# Patient Record
Sex: Male | Born: 1985 | Race: White | Hispanic: No | Smoking: Never smoker
Health system: Southern US, Community
[De-identification: ages and names within clinical notes are randomized; demographics above are authoritative.]

## PROBLEM LIST (undated history)

## (undated) DIAGNOSIS — G40909 Epilepsy, unspecified, not intractable, without status epilepticus: Secondary | ICD-10-CM

## (undated) DIAGNOSIS — N289 Disorder of kidney and ureter, unspecified: Secondary | ICD-10-CM

## (undated) DIAGNOSIS — R569 Unspecified convulsions: Secondary | ICD-10-CM

---
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
## 2006-10-15 ENCOUNTER — Emergency Department (HOSPITAL_COMMUNITY): Admission: EM | Admit: 2006-10-15 | Discharge: 2006-10-15 | Payer: Self-pay | Admitting: Emergency Medicine

## 2007-09-07 ENCOUNTER — Emergency Department (HOSPITAL_COMMUNITY): Admission: EM | Admit: 2007-09-07 | Discharge: 2007-09-07 | Payer: Self-pay | Admitting: Emergency Medicine

## 2007-12-24 ENCOUNTER — Ambulatory Visit (HOSPITAL_COMMUNITY): Admission: RE | Admit: 2007-12-24 | Discharge: 2007-12-24 | Payer: Self-pay | Admitting: Pulmonary Disease

## 2008-07-17 ENCOUNTER — Emergency Department (HOSPITAL_COMMUNITY): Admission: EM | Admit: 2008-07-17 | Discharge: 2008-07-17 | Payer: Self-pay | Admitting: Emergency Medicine

## 2009-10-03 ENCOUNTER — Emergency Department (HOSPITAL_COMMUNITY): Admission: EM | Admit: 2009-10-03 | Discharge: 2009-10-03 | Payer: Self-pay | Admitting: Emergency Medicine

## 2010-01-23 ENCOUNTER — Emergency Department (HOSPITAL_COMMUNITY): Admission: EM | Admit: 2010-01-23 | Discharge: 2010-01-23 | Payer: Self-pay | Admitting: Emergency Medicine

## 2010-05-09 ENCOUNTER — Emergency Department (HOSPITAL_COMMUNITY): Admission: EM | Admit: 2010-05-09 | Discharge: 2010-05-09 | Payer: Self-pay | Admitting: Emergency Medicine

## 2010-10-03 LAB — POCT I-STAT, CHEM 8
BUN: 10 mg/dL (ref 6–23)
Calcium, Ion: 1.16 mmol/L (ref 1.12–1.32)
Chloride: 107 mEq/L (ref 96–112)
Creatinine, Ser: 0.8 mg/dL (ref 0.4–1.5)
Glucose, Bld: 112 mg/dL — ABNORMAL HIGH (ref 70–99)
HCT: 45 % (ref 39.0–52.0)
Hemoglobin: 15.3 g/dL (ref 13.0–17.0)
Potassium: 3.9 mEq/L (ref 3.5–5.1)
Sodium: 141 mEq/L (ref 135–145)
TCO2: 25 mmol/L (ref 0–100)

## 2011-03-03 ENCOUNTER — Emergency Department (HOSPITAL_COMMUNITY): Payer: Medicaid Other

## 2011-03-03 ENCOUNTER — Encounter: Payer: Self-pay | Admitting: *Deleted

## 2011-03-03 ENCOUNTER — Emergency Department (HOSPITAL_COMMUNITY)
Admission: EM | Admit: 2011-03-03 | Discharge: 2011-03-03 | Disposition: A | Discharge status: Home / Self Care | Payer: Medicaid Other | Attending: Emergency Medicine | Admitting: Emergency Medicine

## 2011-03-03 DIAGNOSIS — S20219A Contusion of unspecified front wall of thorax, initial encounter: Secondary | ICD-10-CM | POA: Insufficient documentation

## 2011-03-03 DIAGNOSIS — S80211A Abrasion, right knee, initial encounter: Secondary | ICD-10-CM

## 2011-03-03 DIAGNOSIS — S80212A Abrasion, left knee, initial encounter: Secondary | ICD-10-CM

## 2011-03-03 DIAGNOSIS — IMO0002 Reserved for concepts with insufficient information to code with codable children: Secondary | ICD-10-CM | POA: Insufficient documentation

## 2011-03-03 HISTORY — DX: Unspecified convulsions: R56.9

## 2011-03-03 HISTORY — DX: Epilepsy, unspecified, not intractable, without status epilepticus: G40.909

## 2011-03-03 NOTE — ED Provider Notes (Signed)
Medical screening examination/treatment/procedure(s) were performed by non-physician practitioner and as supervising physician I was immediately available for consultation/collaboration.   Aryaan Persichetti R. Ginamarie Banfield, MD 03/03/11 2310 
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
## 2011-08-18 ENCOUNTER — Emergency Department (HOSPITAL_COMMUNITY)
Admission: EM | Admit: 2011-08-18 | Discharge: 2011-08-18 | Disposition: A | Discharge status: Home / Self Care | Payer: Medicaid Other | Attending: Emergency Medicine | Admitting: Emergency Medicine

## 2011-08-18 ENCOUNTER — Encounter (HOSPITAL_COMMUNITY): Payer: Self-pay | Admitting: *Deleted

## 2011-08-18 DIAGNOSIS — R220 Localized swelling, mass and lump, head: Secondary | ICD-10-CM | POA: Insufficient documentation

## 2011-08-18 DIAGNOSIS — K0889 Other specified disorders of teeth and supporting structures: Secondary | ICD-10-CM

## 2011-08-18 DIAGNOSIS — K089 Disorder of teeth and supporting structures, unspecified: Secondary | ICD-10-CM | POA: Insufficient documentation

## 2011-08-18 MED ORDER — PENICILLIN V POTASSIUM 500 MG PO TABS: 500.0000 mg | ORAL_TABLET | Freq: Four times a day (QID) | ORAL | Status: AC

## 2011-08-18 MED ORDER — OXYCODONE-ACETAMINOPHEN 5-325 MG PO TABS
1.0000 | ORAL_TABLET | Freq: Once | ORAL | Status: AC
  Administered 2011-08-18: 1 via ORAL
  Filled 2011-08-18: qty 1

## 2011-08-18 MED ORDER — PENICILLIN V POTASSIUM 250 MG PO TABS
500.0000 mg | ORAL_TABLET | Freq: Once | ORAL | Status: AC
  Administered 2011-08-18: 500 mg via ORAL
  Filled 2011-08-18: qty 2

## 2011-08-18 MED ORDER — HYDROCODONE-ACETAMINOPHEN 5-325 MG PO TABS: 1.0000 | ORAL_TABLET | Freq: Four times a day (QID) | ORAL | Status: AC | PRN

## 2011-08-18 NOTE — ED Provider Notes (Addendum)
History     CSN: [id number]  Arrival date & time 08/18/11  4098   First MD Initiated Contact with Patient 08/18/11 0354      Chief Complaint  Patient presents with  . Dental Pain    (Consider location/radiation/quality/duration/timing/severity/associated sxs/prior treatment) Patient is a 26 y.o. male presenting with tooth pain. The history is provided by the patient (pt complains of a toothache.  right upper mouth). No language interpreter was used.  Dental PainThe primary symptoms include mouth pain. Primary symptoms do not include oral bleeding, headaches or cough. The symptoms began 2 days ago. The symptoms are worsening. The symptoms are new. The symptoms occur constantly.  Mouth pain began more than 1 week ago. Mouth pain occurs constantly. Affected locations include: cheek(s).  Additional symptoms include: gum swelling and facial swelling. Additional symptoms do not include: trouble swallowing, pain with swallowing and fatigue. Medical issues do not include: alcohol problem.    Past Medical History  Diagnosis Date  . Seizures   . Epilepsy     History reviewed. No pertinent past surgical history.  History reviewed. No pertinent family history.  History  Substance Use Topics  . Smoking status: Never Smoker   . Smokeless tobacco: Not on file  . Alcohol Use: No      Review of Systems  Constitutional: Negative for fatigue.  HENT: Positive for facial swelling. Negative for congestion, trouble swallowing, sinus pressure and ear discharge.        Toothache  Eyes: Negative for discharge.  Respiratory: Negative for cough.   Cardiovascular: Negative for chest pain.  Gastrointestinal: Negative for abdominal pain and diarrhea.  Genitourinary: Negative for frequency and hematuria.  Musculoskeletal: Negative for back pain.  Skin: Negative for rash.  Neurological: Negative for seizures and headaches.  Hematological: Negative.   Psychiatric/Behavioral: Negative for  hallucinations.    Allergies  Aspirin  Home Medications   Current Outpatient Rx  Name Route Sig Dispense Refill  . HYDROCODONE-ACETAMINOPHEN 5-325 MG PO TABS Oral Take 1 tablet by mouth every 6 (six) hours as needed for pain. 20 tablet 0  . PENICILLIN V POTASSIUM 500 MG PO TABS Oral Take 1 tablet (500 mg total) by mouth 4 (four) times daily. 40 tablet 0    BP 148/84  Pulse 100  Temp(Src) 98.2 F (36.8 C) (Oral)  Resp 20  Ht 5\' 6"  (1.676 m)  Wt 265 lb (120.203 kg)  BMI 42.77 kg/m2  SpO2 98%  Physical Exam  Constitutional: He is oriented to person, place, and time. He appears well-developed.  HENT:  Head: Normocephalic and atraumatic.       Tender tooth right upper gum,  With some swelling to cheek  Eyes: Conjunctivae and EOM are normal. No scleral icterus.  Neck: Neck supple. No thyromegaly present.  Cardiovascular: Normal rate and regular rhythm.  Exam reveals no gallop and no friction rub.   No murmur heard. Pulmonary/Chest: No stridor. He has no wheezes. He has no rales. He exhibits no tenderness.  Abdominal: He exhibits no distension. There is no tenderness. There is no rebound.  Musculoskeletal: Normal range of motion. He exhibits no edema.  Lymphadenopathy:    He has no cervical adenopathy.  Neurological: He is oriented to person, place, and time. Coordination normal.  Skin: No rash noted. No erythema.  Psychiatric: He has a normal mood and affect. His behavior is normal.    ED Course  Procedures (including critical care time)  Labs Reviewed - No data to display No  results found.   1. Toothache       MDM          Benny Lennert, MD 08/18/11 5284  Benny Lennert, MD 08/18/11 [phone]

## 2012-02-22 ENCOUNTER — Emergency Department (HOSPITAL_COMMUNITY)
Admission: EM | Admit: 2012-02-22 | Discharge: 2012-02-22 | Disposition: A | Discharge status: Home / Self Care | Payer: Medicaid Other | Attending: Emergency Medicine | Admitting: Emergency Medicine

## 2012-02-22 ENCOUNTER — Encounter (HOSPITAL_COMMUNITY): Payer: Self-pay

## 2012-02-22 DIAGNOSIS — Y9301 Activity, walking, marching and hiking: Secondary | ICD-10-CM | POA: Insufficient documentation

## 2012-02-22 DIAGNOSIS — S93409A Sprain of unspecified ligament of unspecified ankle, initial encounter: Secondary | ICD-10-CM

## 2012-02-22 DIAGNOSIS — Y998 Other external cause status: Secondary | ICD-10-CM | POA: Insufficient documentation

## 2012-02-22 DIAGNOSIS — Y92009 Unspecified place in unspecified non-institutional (private) residence as the place of occurrence of the external cause: Secondary | ICD-10-CM | POA: Insufficient documentation

## 2012-02-22 DIAGNOSIS — G40909 Epilepsy, unspecified, not intractable, without status epilepticus: Secondary | ICD-10-CM | POA: Insufficient documentation

## 2012-02-22 DIAGNOSIS — X500XXA Overexertion from strenuous movement or load, initial encounter: Secondary | ICD-10-CM | POA: Insufficient documentation

## 2012-02-22 MED ORDER — NAPROXEN 500 MG PO TABS: 500.0000 mg | ORAL_TABLET | Freq: Two times a day (BID) | ORAL | Status: AC

## 2012-02-22 MED ORDER — IBUPROFEN 800 MG PO TABS
ORAL_TABLET | ORAL | Status: AC
  Filled 2012-02-22: qty 1

## 2012-02-22 MED ORDER — IBUPROFEN 800 MG PO TABS
800.0000 mg | ORAL_TABLET | Freq: Once | ORAL | Status: AC
  Administered 2012-02-22: 800 mg via ORAL

## 2012-02-22 NOTE — ED Provider Notes (Signed)
History     CSN: [id number]  Arrival date & time 02/22/12  0110   None     Chief Complaint  Patient presents with  . Ankle Injury    (Consider location/radiation/quality/duration/timing/severity/associated sxs/prior treatment) HPI Comments: 26 year old male with a history of ankle sprain which occurred approximately 7 hours prior to arrival. He states that he was walking in his yard, stepped on an uneven piece of ground in his ankle inverted causing gradual onset of pain. He has been able to ambulate since that time though it does hurt his ankle. He admits to having a mild amount of associated swelling. The symptoms are persistent, mild, worse with ambulation.  Patient is a 26 y.o. male presenting with lower extremity injury. The history is provided by the patient and a parent.  Ankle Injury    Past Medical History  Diagnosis Date  . Seizures   . Epilepsy     History reviewed. No pertinent past surgical history.  No family history on file.  History  Substance Use Topics  . Smoking status: Never Smoker   . Smokeless tobacco: Not on file  . Alcohol Use: No      Review of Systems  HENT: Negative for neck pain.   Gastrointestinal: Negative for nausea and vomiting.  Musculoskeletal: Positive for joint swelling ( ankle). Negative for back pain.  Neurological: Negative for weakness and numbness.    Allergies  Aspirin  Home Medications   Current Outpatient Rx  Name Route Sig Dispense Refill  . NAPROXEN 500 MG PO TABS Oral Take 1 tablet (500 mg total) by mouth 2 (two) times daily with a meal. 30 tablet 0    BP 139/81  Pulse 94  Temp 98.3 F (36.8 C) (Oral)  Resp 20  Ht 5\' 7"  (1.702 m)  Wt 260 lb (117.935 kg)  BMI 40.72 kg/m2  SpO2 99%  Physical Exam  Nursing note and vitals reviewed. Constitutional: He appears well-developed and well-nourished. No distress.  HENT:  Head: Normocephalic and atraumatic.  Eyes: Conjunctivae are normal. No scleral icterus.    Cardiovascular: Normal rate, regular rhythm and intact distal pulses.   Pulmonary/Chest: Effort normal and breath sounds normal.  Musculoskeletal: He exhibits tenderness. He exhibits no edema.       Focal tenderness to palpation over the ligament inferior to the lateral malleolus of the right ankle. No other tenderness, no bony tenderness, good range of motion with mild pain  Neurological: He is alert.       Normal Sensation to the toes and foot  Skin: Skin is warm and dry. No rash noted. He is not diaphoretic.    ED Course  Procedures (including critical care time)  Labs Reviewed - No data to display No results found.   1. Ankle sprain       MDM  Well-appearing, focal ankle injury but unlikely to be orthopedic fracture. ASO, crutches as needed, rice therapy, anti-inflammatories.  Discharge Prescriptions include:  Naprosyn         Vida Roller, MD 02/22/12 0140

## 2012-02-22 NOTE — ED Notes (Signed)
Pt twisted his right ankle when he was walking on unlevel ground.  Pt is ambulatory with minimal diff.

## 2012-02-22 NOTE — ED Notes (Signed)
Mother at bedside states that he is able to take ibuprofen without problems.

## 2012-04-13 ENCOUNTER — Emergency Department (HOSPITAL_COMMUNITY): Payer: Medicaid Other

## 2012-04-13 ENCOUNTER — Encounter (HOSPITAL_COMMUNITY): Payer: Self-pay | Admitting: *Deleted

## 2012-04-13 ENCOUNTER — Emergency Department (HOSPITAL_COMMUNITY)
Admission: EM | Admit: 2012-04-13 | Discharge: 2012-04-13 | Disposition: A | Discharge status: Home / Self Care | Payer: Medicaid Other | Attending: Emergency Medicine | Admitting: Emergency Medicine

## 2012-04-13 DIAGNOSIS — Y92009 Unspecified place in unspecified non-institutional (private) residence as the place of occurrence of the external cause: Secondary | ICD-10-CM | POA: Insufficient documentation

## 2012-04-13 DIAGNOSIS — G40909 Epilepsy, unspecified, not intractable, without status epilepticus: Secondary | ICD-10-CM | POA: Insufficient documentation

## 2012-04-13 DIAGNOSIS — S93409A Sprain of unspecified ligament of unspecified ankle, initial encounter: Secondary | ICD-10-CM | POA: Insufficient documentation

## 2012-04-13 DIAGNOSIS — W19XXXA Unspecified fall, initial encounter: Secondary | ICD-10-CM | POA: Insufficient documentation

## 2012-04-13 MED ORDER — IBUPROFEN 800 MG PO TABS
800.0000 mg | ORAL_TABLET | Freq: Once | ORAL | Status: AC
  Administered 2012-04-13: 800 mg via ORAL
  Filled 2012-04-13: qty 1

## 2012-04-13 MED ORDER — HYDROCODONE-ACETAMINOPHEN 5-325 MG PO TABS: ORAL_TABLET | ORAL | Status: DC

## 2012-04-13 MED ORDER — MELOXICAM 7.5 MG PO TABS: ORAL_TABLET | ORAL | Status: DC

## 2012-04-13 MED ORDER — HYDROCODONE-ACETAMINOPHEN 5-325 MG PO TABS
2.0000 | ORAL_TABLET | Freq: Once | ORAL | Status: AC
  Administered 2012-04-13: 2 via ORAL
  Filled 2012-04-13: qty 2

## 2012-04-13 MED ORDER — ONDANSETRON HCL 4 MG PO TABS
4.0000 mg | ORAL_TABLET | Freq: Once | ORAL | Status: AC
  Administered 2012-04-13: 4 mg via ORAL
  Filled 2012-04-13: qty 1

## 2012-04-13 NOTE — ED Provider Notes (Signed)
Medical screening examination/treatment/procedure(s) were performed by non-physician practitioner and as supervising physician I was immediately available for consultation/collaboration.   Abdiaziz Klahn L Andrian Sabala, MD 04/13/12 2249 

## 2012-04-13 NOTE — ED Notes (Signed)
Pt complains of pain and swelling to L anke and L medial foot. L ankle visibly swollen, no obvious deformity noted at this time. Pt states 1 hour ago he tripped while walking off of his porch.

## 2012-04-13 NOTE — ED Notes (Signed)
Pain lt ankle, fell off porch 1-2 ft.

## 2012-04-13 NOTE — ED Provider Notes (Signed)
History     CSN: [id number]  Arrival date & time 04/13/12  2143   First MD Initiated Contact with Patient 04/13/12 2202      Chief Complaint  Patient presents with  . Ankle Pain    (Consider location/radiation/quality/duration/timing/severity/associated sxs/prior treatment) Patient is a 26 y.o. male presenting with ankle pain. The history is provided by the patient.  Ankle Pain  The incident occurred 3 to 5 hours ago. The incident occurred at home. The injury mechanism was a fall. The pain is present in the left ankle. The quality of the pain is described as aching. The pain is moderate. The pain has been constant since onset. Associated symptoms include inability to bear weight. Pertinent negatives include no numbness. He reports no foreign bodies present. The symptoms are aggravated by bearing weight. He has tried nothing for the symptoms.    Past Medical History  Diagnosis Date  . Seizures   . Epilepsy     History reviewed. No pertinent past surgical history.  History reviewed. No pertinent family history.  History  Substance Use Topics  . Smoking status: Never Smoker   . Smokeless tobacco: Not on file  . Alcohol Use: No      Review of Systems  Constitutional: Negative for activity change.       All ROS Neg except as noted in HPI  HENT: Negative for nosebleeds and neck pain.   Eyes: Negative for photophobia and discharge.  Respiratory: Negative for cough, shortness of breath and wheezing.   Cardiovascular: Negative for chest pain and palpitations.  Gastrointestinal: Negative for abdominal pain and blood in stool.  Genitourinary: Negative for dysuria, frequency and hematuria.  Musculoskeletal: Negative for back pain and arthralgias.  Skin: Negative.   Neurological: Positive for seizures. Negative for dizziness, speech difficulty and numbness.  Psychiatric/Behavioral: Negative for hallucinations and confusion.    Allergies  Aspirin  Home Medications    Current Outpatient Rx  Name Route Sig Dispense Refill  . NAPROXEN 500 MG PO TABS Oral Take 1 tablet (500 mg total) by mouth 2 (two) times daily with a meal. 30 tablet 0    BP 129/79  Pulse 94  Temp 98.2 F (36.8 C) (Oral)  Resp 18  Ht 5\' 7"  (1.702 m)  Wt 265 lb (120.203 kg)  BMI 41.50 kg/m2  SpO2 100%  Physical Exam  Nursing note and vitals reviewed. Constitutional: He is oriented to person, place, and time. He appears well-developed and well-nourished.  Non-toxic appearance.  HENT:  Head: Normocephalic.  Right Ear: Tympanic membrane and external ear normal.  Left Ear: Tympanic membrane and external ear normal.  Eyes: EOM and lids are normal. Pupils are equal, round, and reactive to light.  Neck: Normal range of motion. Neck supple. Carotid bruit is not present.  Cardiovascular: Normal rate, regular rhythm, normal heart sounds, intact distal pulses and normal pulses.   Pulmonary/Chest: Breath sounds normal. No respiratory distress.  Abdominal: Soft. Bowel sounds are normal. There is no tenderness. There is no guarding.  Musculoskeletal: Normal range of motion.       There is full range of motion of the left hip and knee. There is no deformity of the tib-fib area. There is swelling of the lateral malleolus. There is pain and tenderness to palpation of the lateral malleolus. This will range of motion of the toes. Capillary refill is less than 3 seconds.  Lymphadenopathy:       Head (right side): No submandibular adenopathy present.  Head (left side): No submandibular adenopathy present.    He has no cervical adenopathy.  Neurological: He is alert and oriented to person, place, and time. He has normal strength. No cranial nerve deficit or sensory deficit.  Skin: Skin is warm and dry.  Psychiatric: He has a normal mood and affect. His speech is normal.    ED Course  Procedures (including critical care time)  Labs Reviewed - No data to display Dg Ankle Complete  Left  04/13/2012  *RADIOLOGY REPORT*  Clinical Data: Ankle pain  LEFT ANKLE COMPLETE - 3+ VIEW  Comparison: None.  Findings: Ankle mortise intact and the talar dome is normal.  No evidence of malleolar fracture.  No joint effusion. Calcaneus is normal.  IMPRESSION: No ankle fracture.   Original Report Authenticated By: Genevive Bi, M.D.    Dg Foot Complete Left  04/13/2012  *RADIOLOGY REPORT*  Clinical Data: Twisted ankle  LEFT FOOT - COMPLETE 3+ VIEW  Comparison: None.  Findings: No evidence of fracture of the tarsal or metatarsal.  The phalanges are normal.  Joint spaces well maintained.  No soft tissue abnormality.  IMPRESSION: No foot fracture.   Original Report Authenticated By: Genevive Bi, M.D.      No diagnosis found.    MDM  I have reviewed nursing notes, vital signs, and all appropriate lab and imaging results for this patient. X-ray of the left ankle is negative for fracture or dislocation. X-ray of the left foot is negative for fracture or dislocation. The plan at this time is for the patient to be fitted with an ankle stirrup splint and crutches. He is given a prescription for Mobic 7.5 mg and Norco #15 tablets. Patient is asked to keep the ankle elevated apply ice.       Kathie Dike, [place] 04/13/12 2248

## 2012-12-05 ENCOUNTER — Encounter (HOSPITAL_COMMUNITY): Payer: Self-pay | Admitting: Emergency Medicine

## 2012-12-05 ENCOUNTER — Emergency Department (HOSPITAL_COMMUNITY)
Admission: EM | Admit: 2012-12-05 | Discharge: 2012-12-05 | Disposition: A | Discharge status: Home / Self Care | Payer: Medicaid Other | Attending: Emergency Medicine | Admitting: Emergency Medicine

## 2012-12-05 DIAGNOSIS — Z8669 Personal history of other diseases of the nervous system and sense organs: Secondary | ICD-10-CM | POA: Insufficient documentation

## 2012-12-05 DIAGNOSIS — H16139 Photokeratitis, unspecified eye: Secondary | ICD-10-CM | POA: Insufficient documentation

## 2012-12-05 MED ORDER — ERYTHROMYCIN 5 MG/GM OP OINT
TOPICAL_OINTMENT | OPHTHALMIC | Status: AC
  Administered 2012-12-05: 01:00:00
  Filled 2012-12-05: qty 3.5

## 2012-12-05 MED ORDER — HYDROCODONE-ACETAMINOPHEN 5-325 MG PO TABS: 1.0000 | ORAL_TABLET | ORAL | Status: DC | PRN

## 2012-12-05 NOTE — ED Notes (Signed)
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  Patient now c/o bilateral eye pain.
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
## 2013-02-23 ENCOUNTER — Emergency Department (HOSPITAL_COMMUNITY)
Admission: EM | Admit: 2013-02-23 | Discharge: 2013-02-23 | Disposition: A | Discharge status: Home / Self Care | Payer: Medicaid Other | Attending: Emergency Medicine | Admitting: Emergency Medicine

## 2013-02-23 ENCOUNTER — Encounter (HOSPITAL_COMMUNITY): Payer: Self-pay | Admitting: *Deleted

## 2013-02-23 DIAGNOSIS — R220 Localized swelling, mass and lump, head: Secondary | ICD-10-CM | POA: Insufficient documentation

## 2013-02-23 DIAGNOSIS — K029 Dental caries, unspecified: Secondary | ICD-10-CM | POA: Insufficient documentation

## 2013-02-23 DIAGNOSIS — K047 Periapical abscess without sinus: Secondary | ICD-10-CM | POA: Insufficient documentation

## 2013-02-23 DIAGNOSIS — Z8669 Personal history of other diseases of the nervous system and sense organs: Secondary | ICD-10-CM | POA: Insufficient documentation

## 2013-02-23 MED ORDER — HYDROCODONE-ACETAMINOPHEN 5-325 MG PO TABS: 1.0000 | ORAL_TABLET | ORAL | Status: DC | PRN

## 2013-02-23 MED ORDER — HYDROCODONE-ACETAMINOPHEN 5-325 MG PO TABS
1.0000 | ORAL_TABLET | Freq: Once | ORAL | Status: AC
  Administered 2013-02-23: 1 via ORAL
  Filled 2013-02-23: qty 1

## 2013-02-23 MED ORDER — AMOXICILLIN 500 MG PO CAPS: 500.0000 mg | ORAL_CAPSULE | Freq: Three times a day (TID) | ORAL | Status: AC

## 2013-02-23 MED ORDER — AMOXICILLIN 250 MG PO CAPS
500.0000 mg | ORAL_CAPSULE | Freq: Once | ORAL | Status: AC
  Administered 2013-02-23: 500 mg via ORAL
  Filled 2013-02-23: qty 2

## 2013-02-23 NOTE — ED Provider Notes (Signed)
Medical screening examination/treatment/procedure(s) were performed by non-physician practitioner and as supervising physician I was immediately available for consultation/collaboration. Devoria Albe, MD, Armando Gang   Ward Givens, MD 02/23/13 [phone]

## 2013-02-23 NOTE — ED Notes (Signed)
Pt c/o left lower dental pain that started last night,

## 2013-02-23 NOTE — ED Provider Notes (Signed)
CSN: [id number]     Arrival date & time 02/23/13  7829 History     First MD Initiated Contact with Patient 02/23/13 [phone]     Chief Complaint  Patient presents with  . Dental Pain   (Consider location/radiation/quality/duration/timing/severity/associated sxs/prior Treatment) HPI Comments: Carl Ball is a 27 y.o. Male presenting with gradual onset dental pain and gingival swelling in his left lower 1st molar tooth which has an old cavity which has recently become larger when he lost another piece of the tooth.  There has been no fevers, chills, nausea or vomiting, also no complaint of difficulty swallowing,  Although chewing makes pain worse.  The patient has tried tylenol without relief of symptoms.  He sees Dr. Waynetta Sandy of dentistry in [place] and plans to get an appointment with him this week.         The history is provided by the patient and a parent.    Past Medical History  Diagnosis Date  . Seizures   . Epilepsy    History reviewed. No pertinent past surgical history. No family history on file. History  Substance Use Topics  . Smoking status: Never Smoker   . Smokeless tobacco: Not on file  . Alcohol Use: No    Review of Systems  Constitutional: Negative for fever.  HENT: Positive for dental problem. Negative for sore throat, facial swelling, neck pain and neck stiffness.   Respiratory: Negative for shortness of breath.     Allergies  Aspirin  Home Medications   Current Outpatient Rx  Name  Route  Sig  Dispense  Refill  . acetaminophen (TYLENOL) 500 MG tablet   Oral   Take 1,000 mg by mouth every 6 (six) hours as needed for pain.         Marland Kitchen amoxicillin (AMOXIL) 500 MG capsule   Oral   Take 1 capsule (500 mg total) by mouth 3 (three) times daily.   30 capsule   0   . HYDROcodone-acetaminophen (NORCO/VICODIN) 5-325 MG per tablet   Oral   Take 1 tablet by mouth every 4 (four) hours as needed for pain.   15 tablet   0    BP 137/87  Pulse 97   Temp(Src) 98.2 F (36.8 C) (Oral)  Resp 20  SpO2 98% Physical Exam  Constitutional: He is oriented to person, place, and time. He appears well-developed and well-nourished. No distress.  HENT:  Head: Normocephalic and atraumatic. No trismus in the jaw.  Right Ear: Tympanic membrane and external ear normal.  Left Ear: Tympanic membrane and external ear normal.  Mouth/Throat: Oropharynx is clear and moist and mucous membranes are normal. No oral lesions. Dental abscesses and dental caries present.    Generalized poor dentition and poor dental hygiene.  Multiple areas of decay noted.  Eyes: Conjunctivae are normal.  Neck: Normal range of motion. Neck supple.  Cardiovascular: Normal rate and normal heart sounds.   Pulmonary/Chest: Effort normal.  Abdominal: He exhibits no distension.  Musculoskeletal: Normal range of motion.  Lymphadenopathy:    He has no cervical adenopathy.  Neurological: He is alert and oriented to person, place, and time.  Skin: Skin is warm and dry. No erythema.  Psychiatric: He has a normal mood and affect.    ED Course   Procedures (including critical care time)  Labs Reviewed - No data to display No results found. 1. Dental abscess   2. Dental cavities     MDM  Encouraged to f/u with his  dentist this week.  Prescribed amoxil, hydrocodone.    The patient appears reasonably screened and/or stabilized for discharge and I doubt any other medical condition or other [facility] requiring further screening, evaluation, or treatment in the ED at this time prior to discharge.   Burgess Amor, PA-C 02/23/13 0914  Burgess Amor, PA-C 02/23/13 [phone]

## 2013-05-04 ENCOUNTER — Emergency Department (HOSPITAL_COMMUNITY)
Admission: EM | Admit: 2013-05-04 | Discharge: 2013-05-05 | Disposition: A | Discharge status: Home / Self Care | Payer: Medicaid Other | Attending: Emergency Medicine | Admitting: Emergency Medicine

## 2013-05-04 ENCOUNTER — Encounter (HOSPITAL_COMMUNITY): Payer: Self-pay | Admitting: Emergency Medicine

## 2013-05-04 DIAGNOSIS — R51 Headache: Secondary | ICD-10-CM | POA: Insufficient documentation

## 2013-05-04 DIAGNOSIS — H538 Other visual disturbances: Secondary | ICD-10-CM | POA: Insufficient documentation

## 2013-05-04 DIAGNOSIS — R569 Unspecified convulsions: Secondary | ICD-10-CM

## 2013-05-04 DIAGNOSIS — Z79899 Other long term (current) drug therapy: Secondary | ICD-10-CM | POA: Insufficient documentation

## 2013-05-04 DIAGNOSIS — G40909 Epilepsy, unspecified, not intractable, without status epilepticus: Secondary | ICD-10-CM | POA: Insufficient documentation

## 2013-05-04 LAB — CBC WITH DIFFERENTIAL/PLATELET
Basophils Relative: 0 % (ref 0–1)
Eosinophils Absolute: 0 10*3/uL (ref 0.0–0.7)
Hemoglobin: 14.7 g/dL (ref 13.0–17.0)
Lymphs Abs: 2.1 10*3/uL (ref 0.7–4.0)
MCH: 31.1 pg (ref 26.0–34.0)
Monocytes Relative: 7 % (ref 3–12)
Neutro Abs: 8.6 10*3/uL — ABNORMAL HIGH (ref 1.7–7.7)
Neutrophils Relative %: 75 % (ref 43–77)
Platelets: 265 10*3/uL (ref 150–400)
RBC: 4.72 MIL/uL (ref 4.22–5.81)
WBC: 11.6 10*3/uL — ABNORMAL HIGH (ref 4.0–10.5)

## 2013-05-04 MED ORDER — SODIUM CHLORIDE 0.9 % IV BOLUS (SEPSIS)
1000.0000 mL | Freq: Once | INTRAVENOUS | Status: AC
  Administered 2013-05-04: 1000 mL via INTRAVENOUS

## 2013-05-04 MED ORDER — ACETAMINOPHEN 325 MG PO TABS
650.0000 mg | ORAL_TABLET | Freq: Once | ORAL | Status: AC
  Administered 2013-05-04: 650 mg via ORAL
  Filled 2013-05-04: qty 2
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
## 2013-05-04 NOTE — ED Notes (Signed)
Pt states he feel sleepy, pt is alert, laying in bed with eyes closes, response and answers questions appropriately.

## 2013-05-04 NOTE — ED Notes (Signed)
Patient placed on continuous cardiac monitoring, continuous pulse 0x monitoring 
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
## 2013-05-05 LAB — BASIC METABOLIC PANEL
BUN: 5 mg/dL — ABNORMAL LOW (ref 6–23)
Chloride: 104 mEq/L (ref 96–112)
GFR calc Af Amer: >90 mL/min (ref >90)
GFR calc non Af Amer: >90 mL/min (ref >90)
Glucose, Bld: 92 mg/dL (ref 70–99)
Potassium: 3 mEq/L — ABNORMAL LOW (ref 3.5–5.1)
Sodium: 145 mEq/L (ref 135–145)

## 2013-05-05 MED ORDER — LEVETIRACETAM 500 MG/5ML IV SOLN
INTRAVENOUS | Status: AC
  Filled 2013-05-05: qty 5

## 2013-05-05 MED ORDER — LEVETIRACETAM 500 MG PO TABS: 500.0000 mg | ORAL_TABLET | Freq: Two times a day (BID) | ORAL | Status: DC

## 2013-05-05 MED ORDER — POTASSIUM CHLORIDE 10 MEQ/100ML IV SOLN
10.0000 meq | Freq: Once | INTRAVENOUS | Status: DC
  Filled 2013-05-05: qty 100

## 2013-05-05 MED ORDER — SODIUM CHLORIDE 0.9 % IV SOLN
1000.0000 mg | Freq: Once | INTRAVENOUS | Status: AC
  Administered 2013-05-05: 1000 mg via INTRAVENOUS
  Filled 2013-05-05: qty 10

## 2013-05-05 NOTE — ED Notes (Signed)
Patient ambulatory to restroom, assisted by grandfather and RN, clean catch instructions given and advised pt to bring specimen back to room as well.

## 2013-05-14 ENCOUNTER — Other Ambulatory Visit: Payer: Self-pay | Admitting: Neurology

## 2013-05-14 DIAGNOSIS — R569 Unspecified convulsions: Secondary | ICD-10-CM

## 2013-05-17 ENCOUNTER — Ambulatory Visit (HOSPITAL_COMMUNITY)
Admission: RE | Admit: 2013-05-17 | Discharge: 2013-05-17 | Disposition: A | Discharge status: Home / Self Care | Payer: Medicaid Other | Source: Ambulatory Visit | Attending: Neurology | Admitting: Neurology

## 2013-05-17 DIAGNOSIS — R569 Unspecified convulsions: Secondary | ICD-10-CM | POA: Insufficient documentation

## 2014-06-03 ENCOUNTER — Other Ambulatory Visit (HOSPITAL_COMMUNITY): Payer: Self-pay | Admitting: Pulmonary Disease

## 2014-06-03 ENCOUNTER — Ambulatory Visit (HOSPITAL_COMMUNITY)
Admission: RE | Admit: 2014-06-03 | Discharge: 2014-06-03 | Disposition: A | Discharge status: Home / Self Care | Payer: Medicaid Other | Source: Ambulatory Visit | Attending: Pulmonary Disease | Admitting: Pulmonary Disease

## 2014-06-03 DIAGNOSIS — M25561 Pain in right knee: Secondary | ICD-10-CM | POA: Diagnosis not present

## 2014-06-03 DIAGNOSIS — M25562 Pain in left knee: Secondary | ICD-10-CM

## 2014-06-03 DIAGNOSIS — M545 Low back pain, unspecified: Secondary | ICD-10-CM

## 2014-08-27 ENCOUNTER — Encounter (HOSPITAL_COMMUNITY): Payer: Self-pay

## 2014-08-27 ENCOUNTER — Emergency Department (HOSPITAL_COMMUNITY)
Admission: EM | Admit: 2014-08-27 | Discharge: 2014-08-27 | Disposition: A | Discharge status: Home / Self Care | Payer: Medicaid Other | Attending: Emergency Medicine | Admitting: Emergency Medicine

## 2014-08-27 DIAGNOSIS — S0993XA Unspecified injury of face, initial encounter: Secondary | ICD-10-CM | POA: Diagnosis present

## 2014-08-27 DIAGNOSIS — Y9289 Other specified places as the place of occurrence of the external cause: Secondary | ICD-10-CM | POA: Insufficient documentation

## 2014-08-27 DIAGNOSIS — Y9389 Activity, other specified: Secondary | ICD-10-CM | POA: Insufficient documentation

## 2014-08-27 DIAGNOSIS — Y998 Other external cause status: Secondary | ICD-10-CM | POA: Insufficient documentation

## 2014-08-27 DIAGNOSIS — H109 Unspecified conjunctivitis: Secondary | ICD-10-CM | POA: Insufficient documentation

## 2014-08-27 DIAGNOSIS — Z79899 Other long term (current) drug therapy: Secondary | ICD-10-CM | POA: Diagnosis not present

## 2014-08-27 DIAGNOSIS — G40909 Epilepsy, unspecified, not intractable, without status epilepticus: Secondary | ICD-10-CM | POA: Insufficient documentation

## 2014-08-27 DIAGNOSIS — S0083XA Contusion of other part of head, initial encounter: Secondary | ICD-10-CM | POA: Insufficient documentation

## 2014-08-27 MED ORDER — IBUPROFEN 800 MG PO TABS
800.0000 mg | ORAL_TABLET | Freq: Once | ORAL | Status: AC
  Administered 2014-08-27: 800 mg via ORAL
  Filled 2014-08-27: qty 1

## 2014-08-27 MED ORDER — IBUPROFEN 600 MG PO TABS: 600.0000 mg | ORAL_TABLET | Freq: Three times a day (TID) | ORAL | Status: DC | PRN

## 2014-08-27 MED ORDER — TRAMADOL HCL 50 MG PO TABS: 50.0000 mg | ORAL_TABLET | Freq: Four times a day (QID) | ORAL | Status: DC | PRN
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
## 2014-08-27 NOTE — Discharge Instructions (Signed)
Assault, General °Assault includes any behavior, whether intentional or reckless, which results in bodily injury to another person and/or damage to property. Included in this would be any behavior, intentional or reckless, that by its nature would be understood (interpreted) by a reasonable person as intent to harm another person or to damage his/her property. Threats may be oral or written. They may be communicated through regular mail, computer, fax, or phone. These threats may be direct or implied. °FORMS OF ASSAULT INCLUDE: °· Physically assaulting a person. This includes physical threats to inflict physical harm as well as: °¨ Slapping. °¨ Hitting. °¨ Poking. °¨ Kicking. °¨ Punching. °¨ Pushing. °· Arson. °· Sabotage. °· Equipment vandalism. °· Damaging or destroying property. °· Throwing or hitting objects. °· Displaying a weapon or an object that appears to be a weapon in a threatening manner. °¨ Carrying a firearm of any kind. °¨ Using a weapon to harm someone. °· Using greater physical size/strength to intimidate another. °¨ Making intimidating or threatening gestures. °¨ Bullying. °¨ Hazing. °· Intimidating, threatening, hostile, or abusive language directed toward another person. °¨ It communicates the intention to engage in violence against that person. And it leads a reasonable person to expect that violent behavior may occur. °· Stalking another person. °IF IT HAPPENS AGAIN: °· Immediately call for emergency help (911 in [place] °· If someone poses clear and immediate danger to you, seek legal authorities to have a protective or restraining order put in place. °· Less threatening assaults can at least be reported to authorities. °STEPS TO TAKE IF A SEXUAL ASSAULT HAS HAPPENED °· Go to an area of safety. This may include a shelter or staying with a friend. Stay away from the area where you have been attacked. A large percentage of sexual assaults are caused by a friend, relative or associate. °· If  medications were given by your caregiver, take them as directed for the full length of time prescribed. °· Only take over-the-counter or prescription medicines for pain, discomfort, or fever as directed by your caregiver. °· If you have come in contact with a sexual disease, find out if you are to be tested again. If your caregiver is concerned about the HIV/AIDS virus, he/she may require you to have continued testing for several months. °· For the protection of your privacy, test results can not be given over the phone. Make sure you receive the results of your test. If your test results are not back during your visit, make an appointment with your caregiver to find out the results. Do not assume everything is normal if you have not heard from your caregiver or the medical facility. It is important for you to follow up on all of your test results. °· File appropriate papers with authorities. This is important in all assaults, even if it has occurred in a family or by a friend. °SEEK MEDICAL CARE IF: °· You have new problems because of your injuries. °· You have problems that may be because of the medicine you are taking, such as: °¨ Rash. °¨ Itching. °¨ Swelling. °¨ Trouble breathing. °· You develop belly (abdominal) pain, feel sick to your stomach (nausea) or are vomiting. °· You begin to run a temperature. °· You need supportive care or referral to a rape crisis center. These are centers with trained personnel who can help you get through this ordeal. °SEEK IMMEDIATE MEDICAL CARE IF: °· You are afraid of being threatened, beaten, or abused. In [place] call 911. °· You   receive new injuries related to abuse. °· You develop severe pain in any area injured in the assault or have any change in your condition that concerns you. °· You faint or lose consciousness. °· You develop chest pain or shortness of breath. °Document Released: 07/04/2005 Document Revised: 09/26/2011 Document Reviewed: 02/20/2008 °Exit[employer]® Patient  Information ©2015 [employer]. This information is not intended to replace advice given to you by your health care provider. Make sure you discuss any questions you have with your health care provider. ° °Contusion °A contusion is a deep bruise. Contusions happen when an injury causes bleeding under the skin. Signs of bruising include pain, puffiness (swelling), and discolored skin. The contusion may turn blue, purple, or yellow. °HOME CARE  °· Put ice on the injured area. °¨ Put ice in a plastic bag. °¨ Place a towel between your skin and the bag. °¨ Leave the ice on for 15-20 minutes, 03-04 times a day. °· Only take medicine as told by your doctor. °· Rest the injured area. °· If possible, raise (elevate) the injured area to lessen puffiness. °GET HELP RIGHT AWAY IF:  °· You have more bruising or puffiness. °· You have pain that is getting worse. °· Your puffiness or pain is not helped by medicine. °MAKE SURE YOU:  °· Understand these instructions. °· Will watch your condition. °· Will get help right away if you are not doing well or get worse. °Document Released: 12/21/2007 Document Revised: 09/26/2011 Document Reviewed: 05/09/2011 °Exit[employer]® Patient Information ©2015 [employer]. This information is not intended to replace advice given to you by your health care provider. Make sure you discuss any questions you have with your health care provider. ° °
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
## 2014-09-03 ENCOUNTER — Encounter (HOSPITAL_COMMUNITY): Payer: Self-pay | Admitting: Emergency Medicine

## 2014-09-03 ENCOUNTER — Emergency Department (HOSPITAL_COMMUNITY)
Admission: EM | Admit: 2014-09-03 | Discharge: 2014-09-03 | Disposition: A | Discharge status: Home / Self Care | Payer: Medicaid Other | Attending: Emergency Medicine | Admitting: Emergency Medicine

## 2014-09-03 ENCOUNTER — Emergency Department (HOSPITAL_COMMUNITY): Payer: Medicaid Other

## 2014-09-03 DIAGNOSIS — Y92410 Unspecified street and highway as the place of occurrence of the external cause: Secondary | ICD-10-CM | POA: Insufficient documentation

## 2014-09-03 DIAGNOSIS — Y9389 Activity, other specified: Secondary | ICD-10-CM | POA: Insufficient documentation

## 2014-09-03 DIAGNOSIS — S39012A Strain of muscle, fascia and tendon of lower back, initial encounter: Secondary | ICD-10-CM | POA: Insufficient documentation

## 2014-09-03 DIAGNOSIS — G40909 Epilepsy, unspecified, not intractable, without status epilepticus: Secondary | ICD-10-CM | POA: Diagnosis not present

## 2014-09-03 DIAGNOSIS — Y998 Other external cause status: Secondary | ICD-10-CM | POA: Insufficient documentation

## 2014-09-03 DIAGNOSIS — S3992XA Unspecified injury of lower back, initial encounter: Secondary | ICD-10-CM | POA: Diagnosis present

## 2014-09-03 DIAGNOSIS — Z79899 Other long term (current) drug therapy: Secondary | ICD-10-CM | POA: Diagnosis not present

## 2014-09-03 MED ORDER — HYDROCODONE-ACETAMINOPHEN 5-325 MG PO TABS: ORAL_TABLET | ORAL | Status: DC

## 2014-09-03 MED ORDER — CYCLOBENZAPRINE HCL 10 MG PO TABS
10.0000 mg | ORAL_TABLET | Freq: Once | ORAL | Status: AC
Start: 2014-09-03 — End: 2014-09-03
  Administered 2014-09-03: 10 mg via ORAL
  Filled 2014-09-03: qty 1

## 2014-09-03 MED ORDER — CYCLOBENZAPRINE HCL 10 MG PO TABS: 10.0000 mg | ORAL_TABLET | Freq: Three times a day (TID) | ORAL | Status: DC | PRN

## 2014-09-03 MED ORDER — OXYCODONE-ACETAMINOPHEN 5-325 MG PO TABS
1.0000 | ORAL_TABLET | Freq: Once | ORAL | Status: AC
Start: 2014-09-03 — End: 2014-09-03
  Administered 2014-09-03: 1 via ORAL
  Filled 2014-09-03: qty 1
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
## 2014-09-03 NOTE — ED Notes (Signed)
Pt walking into triage.
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
## 2015-03-21 ENCOUNTER — Emergency Department (HOSPITAL_COMMUNITY): Payer: Medicaid Other

## 2015-03-21 ENCOUNTER — Encounter (HOSPITAL_COMMUNITY): Payer: Self-pay

## 2015-03-21 ENCOUNTER — Emergency Department (HOSPITAL_COMMUNITY)
Admission: EM | Admit: 2015-03-21 | Discharge: 2015-03-21 | Disposition: A | Discharge status: Home / Self Care | Payer: Medicaid Other | Attending: Emergency Medicine | Admitting: Emergency Medicine

## 2015-03-21 DIAGNOSIS — Y9389 Activity, other specified: Secondary | ICD-10-CM | POA: Diagnosis not present

## 2015-03-21 DIAGNOSIS — Y998 Other external cause status: Secondary | ICD-10-CM | POA: Diagnosis not present

## 2015-03-21 DIAGNOSIS — S6991XA Unspecified injury of right wrist, hand and finger(s), initial encounter: Secondary | ICD-10-CM | POA: Diagnosis present

## 2015-03-21 DIAGNOSIS — Y9289 Other specified places as the place of occurrence of the external cause: Secondary | ICD-10-CM | POA: Insufficient documentation

## 2015-03-21 DIAGNOSIS — W230XXA Caught, crushed, jammed, or pinched between moving objects, initial encounter: Secondary | ICD-10-CM | POA: Insufficient documentation

## 2015-03-21 DIAGNOSIS — G40909 Epilepsy, unspecified, not intractable, without status epilepticus: Secondary | ICD-10-CM | POA: Diagnosis not present

## 2015-03-21 DIAGNOSIS — S60121A Contusion of right index finger with damage to nail, initial encounter: Secondary | ICD-10-CM | POA: Insufficient documentation

## 2015-03-21 DIAGNOSIS — S6010XA Contusion of unspecified finger with damage to nail, initial encounter: Secondary | ICD-10-CM

## 2015-03-21 MED ORDER — TRAMADOL HCL 50 MG PO TABS: 50.0000 mg | ORAL_TABLET | Freq: Four times a day (QID) | ORAL | Status: DC | PRN
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
## 2015-03-21 NOTE — Discharge Instructions (Signed)
Take tylenol or ibuprofen as needed in addition to the Tramadol. Do not drive while taking the tramadol as it will make you sleepy.
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
## 2015-05-09 ENCOUNTER — Emergency Department (HOSPITAL_COMMUNITY)
Admission: EM | Admit: 2015-05-09 | Discharge: 2015-05-09 | Disposition: A | Discharge status: Home / Self Care | Payer: Medicaid Other | Attending: Emergency Medicine | Admitting: Emergency Medicine

## 2015-05-09 ENCOUNTER — Emergency Department (HOSPITAL_COMMUNITY): Payer: Medicaid Other

## 2015-05-09 ENCOUNTER — Encounter (HOSPITAL_COMMUNITY): Payer: Self-pay | Admitting: *Deleted

## 2015-05-09 DIAGNOSIS — R079 Chest pain, unspecified: Secondary | ICD-10-CM | POA: Insufficient documentation

## 2015-05-09 DIAGNOSIS — R51 Headache: Secondary | ICD-10-CM | POA: Diagnosis present

## 2015-05-09 DIAGNOSIS — Z8669 Personal history of other diseases of the nervous system and sense organs: Secondary | ICD-10-CM | POA: Diagnosis not present

## 2015-05-09 DIAGNOSIS — Z791 Long term (current) use of non-steroidal anti-inflammatories (NSAID): Secondary | ICD-10-CM | POA: Diagnosis not present

## 2015-05-09 DIAGNOSIS — B349 Viral infection, unspecified: Secondary | ICD-10-CM | POA: Diagnosis not present

## 2015-05-09 DIAGNOSIS — R000 Tachycardia, unspecified: Secondary | ICD-10-CM | POA: Diagnosis not present

## 2015-05-09 DIAGNOSIS — H6123 Impacted cerumen, bilateral: Secondary | ICD-10-CM | POA: Insufficient documentation

## 2015-05-09 DIAGNOSIS — H9209 Otalgia, unspecified ear: Secondary | ICD-10-CM | POA: Insufficient documentation

## 2015-05-09 MED ORDER — IBUPROFEN 800 MG PO TABS
800.0000 mg | ORAL_TABLET | Freq: Once | ORAL | Status: AC
  Administered 2015-05-09: 800 mg via ORAL
  Filled 2015-05-09: qty 1

## 2015-05-09 NOTE — ED Notes (Signed)
Multiple complaints, has fever, earache,headache, chest pain and abdominal pain, sore throat with trouble swallowing,dizziness and nausea

## 2015-05-09 NOTE — Discharge Instructions (Signed)
Return to the ED with any concerns including difficulty breathing, vomiting and not able to keep down liquids, fainting, decreased level of alertness/lethargy, or any other alarming symptoms  You should take tylenol and ibuprofen for your symptoms

## 2015-05-09 NOTE — ED Provider Notes (Signed)
CSN: [id number]     Arrival date & time 05/09/15  1101 History  By signing my name below, I, Soijett Blue, attest that this documentation has been prepared under the direction and in the presence of Jerelyn ScottMartha Linker, MD. Electronically Signed: Soijett Blue, ED Scribe. 05/09/2015. 12:12 PM.   Chief Complaint  Patient presents with  . Muscle Pain      Patient is a 29 y.o. male presenting with musculoskeletal pain. The history is provided by the patient. No language interpreter was used.  Muscle Pain This is a new problem. The current episode started more than 1 week ago. The problem occurs constantly. The problem has not changed since onset.Associated symptoms include chest pain, abdominal pain and headaches. Pertinent negatives include no shortness of breath. Nothing aggravates the symptoms. Nothing relieves the symptoms. He has tried nothing for the symptoms.     HPI Comments: Carl Ball is a 29 y.o. male who presents to the Emergency Department complaining of muscle pain onset 6 days. He states that he is having associated symptoms of fever of 102.4 x last night, ear pain, HA, nausea with no vomiting, sore throat with trouble swallowing, mild cough. He states that he has tried tylenol yesterday with no relief for his symptoms. He denies vomiting, diarrhea, and any other symptoms. He denies sick contacts at this time.   Past Medical History  Diagnosis Date  . Seizures (HCC)   . Epilepsy (HCC)    History reviewed. No pertinent past surgical history. No family history on file. Social History  Substance Use Topics  . Smoking status: Never Smoker   . Smokeless tobacco: None  . Alcohol Use: No    Review of Systems  Respiratory: Negative for shortness of breath.   Cardiovascular: Positive for chest pain.  Gastrointestinal: Positive for abdominal pain.  Neurological: Positive for headaches.  All other systems reviewed and are negative.     Allergies  Aspirin  Home  Medications   Prior to Admission medications   Medication Sig Start Date End Date Taking? Authorizing Provider  acetaminophen (TYLENOL) 500 MG tablet Take 1,000 mg by mouth every 6 (six) hours as needed for moderate pain.   Yes Historical Provider, MD  HYDROcodone-acetaminophen (NORCO/VICODIN) 5-325 MG tablet Take 1 tablet by mouth every 6 (six) hours as needed for moderate pain.   Yes Historical Provider, MD  naproxen (NAPROSYN) 500 MG tablet Take 500 mg by mouth 2 (two) times daily with a meal.   Yes Historical Provider, MD  traMADol (ULTRAM) 50 MG tablet Take 1 tablet (50 mg total) by mouth every 6 (six) hours as needed. Patient not taking: Reported on 05/09/2015 03/21/15   Janne NapoleonHope M Neese, NP   BP 119/82 mmHg  Pulse 114  Temp(Src) 98.6 F (37 C) (Oral)  Resp 18  Ht 5\' 7"  (1.702 m)  Wt 242 lb (109.77 kg)  BMI 37.89 kg/m2  SpO2 97%  Vitals reviewed Physical Exam  Physical Examination: General appearance - alert, well appearing, and in no distress Mental status - alert, oriented to person, place, and time Eyes - no conjunctival injection, no scleral icterus Ears - bilateral TM's and external ear canals normal- with some cerumen impaction Mouth - mucous membranes moist, pharynx normal without lesions Neck - supple, no significant adenopathy Chest - clear to auscultation, no wheezes, rales or rhonchi, symmetric air entry Heart - normal rate, regular rhythm, normal S1, S2, no murmurs, rubs, clicks or gallops Abdomen - soft, nontender, nondistended, no masses or  organomegaly Neurological - alert, oriented, normal speech Extremities - peripheral pulses normal, no pedal edema, no clubbing or cyanosis Skin - normal coloration and turgor, no rashes  ED Course  Procedures (including critical care time) DIAGNOSTIC STUDIES: Oxygen Saturation is 99% on RA, nl by my interpretation.    COORDINATION OF CARE: 12:11 PM Discussed treatment plan with pt at bedside which includes CXR and ibuprofen  and pt agreed to plan.    Labs Review Labs Reviewed - No data to display  Imaging Review Dg Chest 2 View  05/09/2015  CLINICAL DATA:  Fever, headache, earache, chest and abdominal pain with sore throat and difficulty swallowing. Dizziness and nausea. EXAM: CHEST  2 VIEW COMPARISON:  03/03/2011 FINDINGS: Lungs are adequately inflated without focal consolidation or effusion. Cardiomediastinal silhouette, bones soft tissues are within normal. IMPRESSION: No active cardiopulmonary disease. Electronically Signed   By: Elberta Fortis M.D.   On: 05/09/2015 12:42   I have personally reviewed and evaluated these images as part of my medical decision-making.   EKG Interpretation None      MDM   Final diagnoses:  Viral syndrome    Pt presenting with c/o fever, body aches, headache, cough.  CXR is reassuring.  Pt has mild tachycardia which is improved after drinking fluids in the ED.  Suspect viral syndrome.  Advised rest, fluids, ibuprofen/tylenol and close followup with PMD.  Discharged with strict return precautions.  Pt agreeable with plan.  Julious Payer, personally performed the services described in this documentation. All medical record entries made by the scribe were at my direction and in my presence.  I have reviewed the chart and discharge instructions and agree that the record reflects my personal performance and is accurate and complete. Ethelda Chick  05/10/2015. 9:50 AM.     Jerelyn Scott, MD 05/10/15 [phone]

## 2015-05-09 NOTE — ED Notes (Signed)
Patient d/c papers given and reviewed. Patient verbalized understanding.  

## 2015-10-07 ENCOUNTER — Ambulatory Visit: Payer: Medicaid Other | Admitting: Orthopaedic Surgery

## 2017-02-17 ENCOUNTER — Emergency Department (HOSPITAL_COMMUNITY)
Admission: EM | Admit: 2017-02-17 | Discharge: 2017-02-17 | Disposition: A | Discharge status: Home / Self Care | Payer: Medicaid Other | Attending: Emergency Medicine | Admitting: Emergency Medicine

## 2017-02-17 ENCOUNTER — Emergency Department (HOSPITAL_COMMUNITY): Payer: Medicaid Other

## 2017-02-17 ENCOUNTER — Encounter (HOSPITAL_COMMUNITY): Payer: Self-pay | Admitting: Emergency Medicine

## 2017-02-17 DIAGNOSIS — M545 Low back pain, unspecified: Secondary | ICD-10-CM
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
## 2017-02-17 NOTE — ED Notes (Signed)
Pt made aware to return if symptoms worsen or if any life threatening symptoms occur.   
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
## 2018-01-01 ENCOUNTER — Other Ambulatory Visit: Payer: Self-pay

## 2018-01-01 ENCOUNTER — Encounter (HOSPITAL_COMMUNITY): Payer: Self-pay | Admitting: Emergency Medicine

## 2018-01-01 DIAGNOSIS — H6123 Impacted cerumen, bilateral: Secondary | ICD-10-CM | POA: Diagnosis not present

## 2018-01-01 DIAGNOSIS — R509 Fever, unspecified: Secondary | ICD-10-CM | POA: Diagnosis not present

## 2018-01-01 DIAGNOSIS — H9203 Otalgia, bilateral: Secondary | ICD-10-CM | POA: Diagnosis present

## 2018-01-01 DIAGNOSIS — Z79899 Other long term (current) drug therapy: Secondary | ICD-10-CM | POA: Diagnosis not present

## 2018-01-01 NOTE — ED Triage Notes (Signed)
Pt C/O bilateral ear pain that "goes down into the neck." Pt states he had a temperature of 100.4 at home.

## 2018-01-02 ENCOUNTER — Emergency Department (HOSPITAL_COMMUNITY)
Admission: EM | Admit: 2018-01-02 | Discharge: 2018-01-02 | Disposition: A | Discharge status: Home / Self Care | Payer: Medicaid Other | Attending: Emergency Medicine | Admitting: Emergency Medicine

## 2018-01-02 DIAGNOSIS — R509 Fever, unspecified: Secondary | ICD-10-CM

## 2018-01-02 DIAGNOSIS — H9201 Otalgia, right ear: Secondary | ICD-10-CM

## 2018-01-02 DIAGNOSIS — H6123 Impacted cerumen, bilateral: Secondary | ICD-10-CM

## 2018-01-02 MED ORDER — AMOXICILLIN 500 MG PO CAPS: 500.0000 mg | ORAL_CAPSULE | Freq: Three times a day (TID) | ORAL | 0 refills | Status: AC

## 2018-01-02 MED ORDER — HYDROGEN PEROXIDE 3 % EX SOLN
Freq: Once | CUTANEOUS | Status: AC
  Administered 2018-01-02: 03:00:00 via TOPICAL

## 2018-01-02 MED ORDER — CARBAMIDE PEROXIDE 6.5 % OT SOLN: 5.0000 [drp] | Freq: Two times a day (BID) | OTIC | 0 refills | Status: AC

## 2018-01-02 NOTE — ED Notes (Signed)
Pt c/o bilateral ear pain that became worse tonight,

## 2018-01-02 NOTE — ED Provider Notes (Signed)
[facility] EMERGENCY DEPARTMENT Provider Note   CSN: [id number] Arrival date & time: 01/01/18  2148     History   Chief Complaint Chief Complaint  Patient presents with  . Otalgia    HPI Carl Ball is a 32 y.o. male.  The history is provided by the patient.  Otalgia  This is a recurrent problem. The current episode started yesterday. There is pain in both ears. The problem occurs constantly. The problem has not changed since onset.The maximum temperature recorded prior to his arrival was 100 to 100.9 F. The fever has been present for less than 1 day. The pain is at a severity of 8/10. The pain is moderate. Associated symptoms include hearing loss. Pertinent negatives include no ear discharge, no headaches, no rhinorrhea, no sore throat, no abdominal pain, no vomiting and no cough. Past medical history comments: reports frequent problems with cerumen impactions.    Past Medical History:  Diagnosis Date  . Epilepsy (HCC)   . Seizures (HCC)     There are no active problems to display for this patient.   History reviewed. No pertinent surgical history.      Home Medications    Prior to Admission medications   Medication Sig Start Date End Date Taking? Authorizing Provider  acetaminophen (TYLENOL) 500 MG tablet Take 1,000 mg by mouth every 6 (six) hours as needed for moderate pain.    [provider]  amoxicillin (AMOXIL) 500 MG capsule Take 1 capsule (500 mg total) by mouth 3 (three) times daily for 10 days. 01/02/18 01/12/18  Burgess Amor, PA-C  carbamide peroxide (DEBROX) 6.5 % OTIC solution Place 5 drops into the right ear 2 (two) times daily for 4 days. 01/02/18 01/06/18  Burgess Amor, PA-C  HYDROcodone-acetaminophen (NORCO/VICODIN) 5-325 MG tablet Take 1 tablet by mouth every 6 (six) hours as needed for moderate pain.    [provider]  naproxen (NAPROSYN) 500 MG tablet Take 500 mg by mouth 2 (two) times daily with a meal.    [provider]    traMADol (ULTRAM) 50 MG tablet Take 1 tablet (50 mg total) by mouth every 6 (six) hours as needed. Patient not taking: Reported on 05/09/2015 03/21/15   Carl Napoleon, NP    Family History No family history on file.  Social History Social History   Tobacco Use  . Smoking status: Never Smoker  . Smokeless tobacco: Never Used  Substance Use Topics  . Alcohol use: No  . Drug use: No     Allergies   Aspirin   Review of Systems Review of Systems  Constitutional: Positive for fever. Negative for chills.  HENT: Positive for congestion, ear pain and hearing loss. Negative for ear discharge, rhinorrhea, sinus pressure, sore throat, trouble swallowing and voice change.   Eyes: Negative for discharge.  Respiratory: Negative for cough, shortness of breath, wheezing and stridor.   Cardiovascular: Negative for chest pain.  Gastrointestinal: Negative for abdominal pain, nausea and vomiting.  Genitourinary: Negative.   Neurological: Negative for dizziness, light-headedness and headaches.     Physical Exam Updated Vital Signs BP 111/68   Pulse 94   Temp 98.7 F (37.1 C) (Oral)   Resp 16   Ht 5\' 8"  (1.727 m)   Wt 113.4 kg (250 lb)   SpO2 98%   BMI 38.01 kg/m   Physical Exam  Constitutional: He is oriented to person, place, and time. He appears well-developed and well-nourished.  HENT:  Head: Normocephalic and atraumatic.  Right Ear: Tympanic membrane and ear canal normal. Decreased hearing is noted.  Left Ear: Tympanic membrane and ear canal normal. Decreased hearing is noted.  Nose: No mucosal edema or rhinorrhea.  Mouth/Throat: Uvula is midline, oropharynx is clear and moist and mucous membranes are normal. No oropharyngeal exudate, posterior oropharyngeal edema, posterior oropharyngeal erythema or tonsillar abscesses. No tonsillar exudate.  Bilateral cerumen impactions.  Eyes: Conjunctivae are normal.  Neck: Full passive range of motion without pain.  Cardiovascular:  Normal rate and normal heart sounds.  Pulmonary/Chest: Effort normal. No respiratory distress. He has no wheezes. He has no rales.  Abdominal: Soft. There is no tenderness.  Musculoskeletal: Normal range of motion.  Lymphadenopathy:    He has no cervical adenopathy.  No head or neck adenopathy  Neurological: He is alert and oriented to person, place, and time.  Skin: Skin is warm and dry. No rash noted.  Psychiatric: He has a normal mood and affect.     ED Treatments / Results  Labs (all labs ordered are listed, but only abnormal results are displayed) Labs Reviewed - No data to display  EKG None  Radiology No results found.  Procedures .Ear Cerumen Removal Date/Time: 01/02/2018 1:00 AM Performed by: Burgess Amor, PA-C Authorized by: Burgess Amor, PA-C   Consent:    Consent obtained:  Verbal   Consent given by:  Patient   Risks discussed:  Bleeding, infection, pain, TM perforation, incomplete removal and dizziness   Alternatives discussed:  No treatment and delayed treatment Procedure details:    Location:  L ear and R ear   Procedure type: curette     Procedure type comment:  Curette and irrigation Post-procedure details:    Hearing quality:  Improved   Patient tolerance of procedure:  Tolerated well, no immediate complications Comments:     Pt with near complete resolution of left cerumen impaction, TM intact, no erythema.  Right TM with persistent impaction despite multiple flushes and use of curette.  His pain was improved but still with decreased hearing acuity on the right.    (including critical care time)  Medications Ordered in ED Medications  hydrogen peroxide 3 % external solution ( Topical Given by Other 01/02/18 0243)     Initial Impression / Assessment and Plan / ED Course  I have reviewed the triage vital signs and the nursing notes.  Pertinent labs & imaging results that were available during my care of the patient were reviewed by me and considered  in my medical decision making (see chart for details).     Pt with bilateral cerumen impaction, left resolved, right still with moderate impaction. Fever per pt report, none here. Will cover with amoxil given unable to eval right TM and no other sx to suggest a different course of the fever.  Also prescribed debrox for use x 4 days, then f/u with pcp for flushing of the right ear.  Pt agrees with and understands plan.    Final Clinical Impressions(s) / ED Diagnoses   Final diagnoses:  Right ear pain  Bilateral impacted cerumen  Fever, unspecified fever cause    ED Discharge Orders        Ordered    amoxicillin (AMOXIL) 500 MG capsule  3 times daily     01/02/18 0226    carbamide peroxide (DEBROX) 6.5 % OTIC solution  2 times daily     01/02/18 0226       Burgess Amor, PA-C 01/02/18 1205    Horton,  Mayer Maskerourtney F, MD 01/02/18 [phone]

## 2018-01-02 NOTE — Discharge Instructions (Addendum)
Apply the debrox to your right ear to continue softening the ear wax as discussed.  Have your doctor recheck this ear for flushing if the wax plug does not easily come out with gentle flushing as discussed after you have used this solution for 4 days.  I am treating you for a potential ear infection given your fever.  Take the entire course of the antibiotic prescribed.

## 2018-01-24 DIAGNOSIS — E669 Obesity, unspecified: Secondary | ICD-10-CM | POA: Diagnosis not present

## 2018-01-24 DIAGNOSIS — H6123 Impacted cerumen, bilateral: Secondary | ICD-10-CM | POA: Diagnosis not present

## 2018-01-24 DIAGNOSIS — R569 Unspecified convulsions: Secondary | ICD-10-CM | POA: Diagnosis not present

## 2018-04-10 ENCOUNTER — Emergency Department (HOSPITAL_COMMUNITY)
Admission: EM | Admit: 2018-04-10 | Discharge: 2018-04-11 | Disposition: A | Discharge status: Home / Self Care | Payer: Medicaid Other | Attending: Emergency Medicine | Admitting: Emergency Medicine

## 2018-04-10 ENCOUNTER — Other Ambulatory Visit: Payer: Self-pay

## 2018-04-10 ENCOUNTER — Encounter (HOSPITAL_COMMUNITY): Payer: Self-pay

## 2018-04-10 DIAGNOSIS — Z79899 Other long term (current) drug therapy: Secondary | ICD-10-CM | POA: Diagnosis not present

## 2018-04-10 DIAGNOSIS — K8020 Calculus of gallbladder without cholecystitis without obstruction: Secondary | ICD-10-CM | POA: Diagnosis not present

## 2018-04-10 DIAGNOSIS — R109 Unspecified abdominal pain: Secondary | ICD-10-CM | POA: Diagnosis present

## 2018-04-10 DIAGNOSIS — N201 Calculus of ureter: Secondary | ICD-10-CM | POA: Insufficient documentation

## 2018-04-10 HISTORY — DX: Disorder of kidney and ureter, unspecified: N28.9

## 2018-04-10 NOTE — ED Triage Notes (Signed)
Pt arrives via POV c/o right side flank pain that began in back earlier and has moved towards groin. Pt reports Hx of Kidney Stones and thinks this is one. Pt reports seeing blood in urine earlier.

## 2018-04-11 ENCOUNTER — Emergency Department (HOSPITAL_COMMUNITY): Payer: Medicaid Other

## 2018-04-11 DIAGNOSIS — K8020 Calculus of gallbladder without cholecystitis without obstruction: Secondary | ICD-10-CM | POA: Diagnosis not present

## 2018-04-11 LAB — URINALYSIS, ROUTINE W REFLEX MICROSCOPIC
BACTERIA UA: NONE SEEN
Bilirubin Urine: NEGATIVE
GLUCOSE, UA: NEGATIVE mg/dL
Ketones, ur: NEGATIVE mg/dL
LEUKOCYTES UA: NEGATIVE
Nitrite: NEGATIVE
PROTEIN: 30 mg/dL — AB
RBC / HPF: >50 RBC/hpf — ABNORMAL HIGH (ref 0–5)
SPECIFIC GRAVITY, URINE: 1.012 (ref 1.005–1.030)
pH: 7 (ref 5.0–8.0)

## 2018-04-11 LAB — BASIC METABOLIC PANEL
Anion gap: 7 (ref 5–15)
BUN: 9 mg/dL (ref 6–20)
CALCIUM: 9 mg/dL (ref 8.9–10.3)
CO2: 26 mmol/L (ref 22–32)
Chloride: 108 mmol/L (ref 98–111)
Creatinine, Ser: 1.01 mg/dL (ref 0.61–1.24)
GFR calc Af Amer: >60 mL/min (ref >60)
GLUCOSE: 104 mg/dL — AB (ref 70–99)
Potassium: 3.9 mmol/L (ref 3.5–5.1)
Sodium: 141 mmol/L (ref 135–145)

## 2018-04-11 MED ORDER — ONDANSETRON HCL 4 MG/2ML IJ SOLN
4.0000 mg | Freq: Once | INTRAMUSCULAR | Status: AC
  Administered 2018-04-11: 4 mg via INTRAVENOUS
  Filled 2018-04-11: qty 2

## 2018-04-11 MED ORDER — OXYCODONE-ACETAMINOPHEN 5-325 MG PO TABS: 1.0000 | ORAL_TABLET | ORAL | 0 refills | Status: DC | PRN

## 2018-04-11 MED ORDER — MORPHINE SULFATE (PF) 4 MG/ML IV SOLN
4.0000 mg | Freq: Once | INTRAVENOUS | Status: AC
  Administered 2018-04-11: 4 mg via INTRAVENOUS
  Filled 2018-04-11: qty 1

## 2018-04-11 MED ORDER — KETOROLAC TROMETHAMINE 30 MG/ML IJ SOLN
15.0000 mg | Freq: Once | INTRAMUSCULAR | Status: AC
  Administered 2018-04-11: 15 mg via INTRAVENOUS
  Filled 2018-04-11: qty 1

## 2018-04-11 NOTE — ED Notes (Signed)
Patient transported to CT 

## 2018-04-11 NOTE — Discharge Instructions (Addendum)
Strain your urine and save the stone for your urologists evaluation if it passes.  Get rechecked immediately for development of any fevers, worsened pain or uncontrolled vomiting.  You may take the oxycodone prescribed for pain relief.  This will make you drowsy - do not drive within 4 hours of taking this medication.

## 2018-04-11 NOTE — ED Provider Notes (Signed)
[name] Provider Note   CSN: [id number] Arrival date & time: 04/10/18  2314     History   Chief Complaint Chief Complaint  Patient presents with  . Flank Pain    HPI Carl Ball is a 32 y.o. male.  The history is provided by the patient.  Flank Pain  This is a new problem. The current episode started 1 to 2 hours ago. The problem occurs constantly. The problem has not changed since onset.Associated symptoms include abdominal pain. Pertinent negatives include no chest pain, no headaches and no shortness of breath. Associated symptoms comments: Pain has progressed, started in the right flank, now has moved to the right groin.. Nothing aggravates the symptoms. Nothing relieves the symptoms. He has tried nothing for the symptoms.    Past Medical History:  Diagnosis Date  . Epilepsy (HCC)   . Renal disorder    Kidney Stone 2008  . Seizures (HCC)     There are no active problems to display for this patient.   History reviewed. No pertinent surgical history.      Home Medications    Prior to Admission medications   Medication Sig Start Date End Date Taking? Authorizing Provider  acetaminophen (TYLENOL) 500 MG tablet Take 1,000 mg by mouth every 6 (six) hours as needed for moderate pain.    [provider]  HYDROcodone-acetaminophen (NORCO/VICODIN) 5-325 MG tablet Take 1 tablet by mouth every 6 (six) hours as needed for moderate pain.    [provider]  naproxen (NAPROSYN) 500 MG tablet Take 500 mg by mouth 2 (two) times daily with a meal.    [provider]  oxyCODONE-acetaminophen (PERCOCET/ROXICET) 5-325 MG tablet Take 1 tablet by mouth every 4 (four) hours as needed. 04/11/18   Burgess AmorIdol, Felica Chargois, PA-C  traMADol (ULTRAM) 50 MG tablet Take 1 tablet (50 mg total) by mouth every 6 (six) hours as needed. Patient not taking: Reported on 05/09/2015 03/21/15   Janne NapoleonNeese, Hope M, NP    Family History Family History  Problem Relation  Age of Onset  . Heart failure Mother   . Osteoarthritis Mother   . Hypertension Mother   . Diabetes Father   . Osteoarthritis Father   . Hypertension Father     Social History Social History   Tobacco Use  . Smoking status: Never Smoker  . Smokeless tobacco: Never Used  Substance Use Topics  . Alcohol use: No  . Drug use: No     Allergies   Aspirin   Review of Systems Review of Systems  Constitutional: Negative for chills and fever.  HENT: Negative.   Respiratory: Negative.  Negative for shortness of breath.   Cardiovascular: Negative for chest pain.  Gastrointestinal: Positive for abdominal pain. Negative for nausea and vomiting.  Genitourinary: Positive for flank pain and hematuria. Negative for dysuria, scrotal swelling and urgency.  Neurological: Negative for headaches.     Physical Exam Updated Vital Signs BP 130/77 (BP Location: Left Arm)   Pulse 96   Temp 97.8 F (36.6 C) (Oral)   Resp 18   Ht 5\' 7"  (1.702 m)   Wt 113.4 kg   SpO2 97%   BMI 39.16 kg/m   Physical Exam  Constitutional: He appears well-developed and well-nourished.  HENT:  Head: Normocephalic and atraumatic.  Eyes: Conjunctivae are normal.  Neck: Normal range of motion.  Cardiovascular: Normal rate, regular rhythm, normal heart sounds and intact distal pulses.  Pulmonary/Chest: Effort normal and breath sounds normal.  He has no wheezes.  Abdominal: Soft. Bowel sounds are normal. He exhibits no distension. There is tenderness in the suprapubic area. There is no rigidity, no rebound, no guarding and no CVA tenderness.  Musculoskeletal: Normal range of motion.  Neurological: He is alert.  Skin: Skin is warm and dry.  Psychiatric: He has a normal mood and affect.  Nursing note and vitals reviewed.    ED Treatments / Results  Labs (all labs ordered are listed, but only abnormal results are displayed) Labs Reviewed  BASIC METABOLIC PANEL - Abnormal; Notable for the following  components:      Result Value   Glucose, Bld 104 (*)    All other components within normal limits  URINALYSIS, ROUTINE W REFLEX MICROSCOPIC - Abnormal; Notable for the following components:   APPearance HAZY (*)    Hgb urine dipstick LARGE (*)    Protein, ur 30 (*)    RBC / HPF >50 (*)    All other components within normal limits    EKG None  Radiology Ct Renal Stone Study  Result Date: 04/11/2018 CLINICAL DATA:  32 year old male with right flank pain. EXAM: CT ABDOMEN AND PELVIS WITHOUT CONTRAST TECHNIQUE: Multidetector CT imaging of the abdomen and pelvis was performed following the standard protocol without IV contrast. COMPARISON:  CT of the abdomen pelvis dated 01/23/2010 FINDINGS: Evaluation of this exam is limited in the absence of intravenous contrast. Lower chest: The visualized lung bases are clear. No intra-abdominal free air or free fluid. Hepatobiliary: The liver is unremarkable. No intrahepatic biliary ductal dilatation. Multiple small stones noted within the gallbladder. No pericholecystic fluid. Pancreas: Unremarkable. No pancreatic ductal dilatation or surrounding inflammatory changes. Spleen: Normal in size without focal abnormality. Adrenals/Urinary Tract: The adrenal glands are unremarkable. There is a 2 mm distal right ureteral calculus with mild right hydronephrosis. The left kidney, left ureter, and urinary bladder appear unremarkable. Stomach/Bowel: There is no bowel obstruction or active inflammation. Normal appendix. Vascular/Lymphatic: The abdominal aorta and IVC are grossly unremarkable on this noncontrast CT. No portal venous gas. There is no adenopathy. Reproductive: The prostate and seminal vesicles are grossly unremarkable. No pelvic mass. Other: Small fat containing umbilical hernia Musculoskeletal: No acute or significant osseous findings. IMPRESSION: 1. A 2 mm distal right ureteral calculus with mild right hydronephrosis. 2. Cholelithiasis. Electronically Signed    By: Elgie Collard M.D.   On: 04/11/2018 01:29    Procedures Procedures (including critical care time)  Medications Ordered in ED Medications  morphine 4 MG/ML injection 4 mg (4 mg Intravenous Given 04/11/18 0034)  ondansetron (ZOFRAN) injection 4 mg (4 mg Intravenous Given 04/11/18 0034)     Initial Impression / Assessment and Plan / ED Course  I have reviewed the triage vital signs and the nursing notes.  Pertinent labs & imaging results that were available during my care of the patient were reviewed by me and considered in my medical decision making (see chart for details).     Patient was given morphine and Zofran for pain relief here.  Urine strainer given.  UA is negative for infection.  CT scan positive for right distal ureteral stone.  Patient was given pain medications, instructed to strain all urine, referral to Dr. Berneice Heinrich with urology for follow-up care.  Strict return precautions given including fevers, uncontrolled vomiting or pain.  Final Clinical Impressions(s) / ED Diagnoses   Final diagnoses:  Right ureteral stone    ED Discharge Orders  Ordered    oxyCODONE-acetaminophen (PERCOCET/ROXICET) 5-325 MG tablet  Every 4 hours PRN     04/11/18 0130           Burgess Amor, PA-C 04/11/18 0133    Zadie Rhine, MD 04/11/18 [phone]

## 2018-04-25 DIAGNOSIS — E669 Obesity, unspecified: Secondary | ICD-10-CM | POA: Diagnosis not present

## 2018-04-25 DIAGNOSIS — N200 Calculus of kidney: Secondary | ICD-10-CM | POA: Diagnosis not present

## 2018-04-25 DIAGNOSIS — R569 Unspecified convulsions: Secondary | ICD-10-CM | POA: Diagnosis not present

## 2018-08-04 ENCOUNTER — Other Ambulatory Visit: Payer: Self-pay

## 2018-08-04 ENCOUNTER — Emergency Department (HOSPITAL_COMMUNITY)
Admission: EM | Admit: 2018-08-04 | Discharge: 2018-08-04 | Disposition: A | Discharge status: Home / Self Care | Payer: Medicaid Other | Attending: Emergency Medicine | Admitting: Emergency Medicine

## 2018-08-04 ENCOUNTER — Encounter (HOSPITAL_COMMUNITY): Payer: Self-pay | Admitting: Emergency Medicine

## 2018-08-04 DIAGNOSIS — Y9289 Other specified places as the place of occurrence of the external cause: Secondary | ICD-10-CM | POA: Insufficient documentation

## 2018-08-04 DIAGNOSIS — S61411A Laceration without foreign body of right hand, initial encounter: Secondary | ICD-10-CM | POA: Insufficient documentation

## 2018-08-04 DIAGNOSIS — Y9389 Activity, other specified: Secondary | ICD-10-CM | POA: Diagnosis not present

## 2018-08-04 DIAGNOSIS — Z23 Encounter for immunization: Secondary | ICD-10-CM | POA: Diagnosis not present

## 2018-08-04 DIAGNOSIS — W260XXA Contact with knife, initial encounter: Secondary | ICD-10-CM | POA: Insufficient documentation

## 2018-08-04 DIAGNOSIS — Y998 Other external cause status: Secondary | ICD-10-CM | POA: Diagnosis not present

## 2018-08-04 MED ORDER — TETANUS-DIPHTH-ACELL PERTUSSIS 5-2.5-18.5 LF-MCG/0.5 IM SUSP
0.5000 mL | Freq: Once | INTRAMUSCULAR | Status: AC
  Administered 2018-08-04: 0.5 mL via INTRAMUSCULAR
  Filled 2018-08-04: qty 0.5

## 2018-08-04 NOTE — ED Provider Notes (Signed)
[facility] EMERGENCY DEPARTMENT Provider Note   CSN: [id number] Arrival date & time: 08/04/18  1505     History   Chief Complaint Chief Complaint  Patient presents with  . Laceration    HPI Carl Ball is a 33 y.o. male.  The history is provided by the patient. No language interpreter was used.  Laceration  Location:  Hand Hand laceration location:  R hand Length:  1 cm Depth:  Cutaneous Quality: straight   Laceration mechanism:  Razor Pain details:    Quality:  Aching   Severity:  No pain Worsened by:  Nothing Ineffective treatments:  None tried Tetanus status:  Out of date   Past Medical History:  Diagnosis Date  . Epilepsy (HCC)   . Renal disorder    Kidney Stone 2008  . Seizures (HCC)     There are no active problems to display for this patient.   History reviewed. No pertinent surgical history.      Home Medications    Prior to Admission medications   Medication Sig Start Date End Date Taking? Authorizing Provider  acetaminophen (TYLENOL) 500 MG tablet Take 1,000 mg by mouth every 6 (six) hours as needed for moderate pain.    [provider]  HYDROcodone-acetaminophen (NORCO/VICODIN) 5-325 MG tablet Take 1 tablet by mouth every 6 (six) hours as needed for moderate pain.    [provider]  naproxen (NAPROSYN) 500 MG tablet Take 500 mg by mouth 2 (two) times daily with a meal.    [provider]  oxyCODONE-acetaminophen (PERCOCET/ROXICET) 5-325 MG tablet Take 1 tablet by mouth every 4 (four) hours as needed. 04/11/18   Burgess AmorIdol, Julie, PA-C  traMADol (ULTRAM) 50 MG tablet Take 1 tablet (50 mg total) by mouth every 6 (six) hours as needed. Patient not taking: Reported on 05/09/2015 03/21/15   Janne NapoleonNeese, Hope M, NP    Family History Family History  Problem Relation Age of Onset  . Heart failure Mother   . Osteoarthritis Mother   . Hypertension Mother   . Diabetes Father   . Osteoarthritis Father   . Hypertension Father      Social History Social History   Tobacco Use  . Smoking status: Never Smoker  . Smokeless tobacco: Never Used  Substance Use Topics  . Alcohol use: No  . Drug use: No     Allergies   Aspirin   Review of Systems Review of Systems  Skin: Positive for wound.  All other systems reviewed and are negative.    Physical Exam Updated Vital Signs BP 137/90 (BP Location: Right Arm)   Pulse 78   Temp 98 F (36.7 C) (Oral)   Resp 16   Ht 5\' 6"  (1.676 m)   Wt 108.7 kg   SpO2 100%   BMI 38.67 kg/m   Physical Exam Vitals signs and nursing note reviewed.  Constitutional:      Appearance: He is well-developed.  HENT:     Head: Normocephalic and atraumatic.  Eyes:     Conjunctiva/sclera: Conjunctivae normal.  Neck:     Musculoskeletal: Neck supple.  Cardiovascular:     Rate and Rhythm: Normal rate and regular rhythm.     Heart sounds: No murmur.  Pulmonary:     Effort: Pulmonary effort is normal. No respiratory distress.     Breath sounds: Normal breath sounds.  Abdominal:     Tenderness: There is no abdominal tenderness.  Skin:    General: Skin is warm.  Capillary Refill: Capillary refill takes less than 2 seconds.     Comments: 37mm straight laceration web space thenar area  Neurological:     General: No focal deficit present.     Mental Status: He is alert.  Psychiatric:        Mood and Affect: Mood normal.      ED Treatments / Results  Labs (all labs ordered are listed, but only abnormal results are displayed) Labs Reviewed - No data to display  EKG None  Radiology No results found.  Procedures .Marland KitchenLaceration Repair Date/Time: 08/04/2018 3:23 PM Performed by: Elson Areas, PA-C Authorized by: Elson Areas, PA-C   Consent:    Consent obtained:  Verbal   Consent given by:  Patient   Risks discussed:  Infection, need for additional repair, pain, poor cosmetic result and poor wound healing   Alternatives discussed:  No treatment and  delayed treatment Universal protocol:    Procedure explained and questions answered to patient or proxy's satisfaction: yes     Relevant documents present and verified: yes     Test results available and properly labeled: yes     Imaging studies available: yes     Required blood products, implants, devices, and special equipment available: yes     Site/side marked: yes     Immediately prior to procedure, a time out was called: yes     Patient identity confirmed:  Verbally with patient Anesthesia (see MAR for exact dosages):    Anesthesia method:  Local infiltration Laceration details:    Length (cm):  5 Repair type:    Repair type:  Simple Pre-procedure details:    Preparation:  Patient was prepped and draped in usual sterile fashion Exploration:    Contaminated: no   Treatment:    Amount of cleaning:  Standard Skin repair:    Repair method:  Tissue adhesive Approximation:    Approximation:  Close Post-procedure details:    Patient tolerance of procedure:  Tolerated well, no immediate complications   (including critical care time)  Medications Ordered in ED Medications  Tdap (BOOSTRIX) injection 0.5 mL (has no administration in time range)     Initial Impression / Assessment and Plan / ED Course  I have reviewed the triage vital signs and the nursing notes.  Pertinent labs & imaging results that were available during my care of the patient were reviewed by me and considered in my medical decision making (see chart for details).       Final Clinical Impressions(s) / ED Diagnoses   Final diagnoses:  Laceration of right hand without foreign body, initial encounter    ED Discharge Orders    None    An After Visit Summary was printed and given to the patient.    Elson Areas, PA-C 08/04/18 1523    Samuel Jester, DO 08/05/18 [phone]

## 2018-08-04 NOTE — ED Triage Notes (Signed)
Patient c/o laceration to left hand. Per patient cut hand with box cutter. Bandage in place, no active bleeding. Patient unsure of last tetanus vaccination.

## 2018-08-04 NOTE — Discharge Instructions (Addendum)
Return if any problems.

## 2018-11-20 ENCOUNTER — Encounter (HOSPITAL_COMMUNITY): Payer: Self-pay | Admitting: Emergency Medicine

## 2018-11-20 ENCOUNTER — Emergency Department (HOSPITAL_COMMUNITY)
Admission: EM | Admit: 2018-11-20 | Discharge: 2018-11-20 | Disposition: A | Discharge status: Home / Self Care | Payer: Medicaid Other | Attending: Emergency Medicine | Admitting: Emergency Medicine

## 2018-11-20 ENCOUNTER — Other Ambulatory Visit: Payer: Self-pay

## 2018-11-20 DIAGNOSIS — H5712 Ocular pain, left eye: Secondary | ICD-10-CM | POA: Diagnosis not present

## 2018-11-20 DIAGNOSIS — Z79899 Other long term (current) drug therapy: Secondary | ICD-10-CM | POA: Insufficient documentation

## 2018-11-20 MED ORDER — CIPROFLOXACIN HCL 0.3 % OP SOLN
2.0000 [drp] | OPHTHALMIC | Status: DC
  Administered 2018-11-20: 2 [drp] via OPHTHALMIC
  Filled 2018-11-20: qty 2.5

## 2018-11-20 MED ORDER — CIPROFLOXACIN HCL 0.3 % OP SOLN
OPHTHALMIC | Status: AC
  Filled 2018-11-20: qty 2.5

## 2018-11-20 MED ORDER — FLUORESCEIN SODIUM 1 MG OP STRP
ORAL_STRIP | OPHTHALMIC | Status: AC
  Filled 2018-11-20: qty 1

## 2018-11-20 MED ORDER — TETRACAINE HCL 0.5 % OP SOLN
OPHTHALMIC | Status: AC
  Filled 2018-11-20: qty 4

## 2018-11-20 NOTE — ED Provider Notes (Signed)
[facility] EMERGENCY DEPARTMENT Provider Note   CSN: [id number] Arrival date & time: 11/20/18  0114    History   Chief Complaint Chief Complaint  Patient presents with  . Eye Pain    HPI Carl Ball is a 33 y.o. male.     Patient states he was under a trailer and he felt like something fell in her left eye.  The history is provided by the patient. No language interpreter was used.  Eye Pain  This is a new problem. The current episode started 1 to 2 hours ago. The problem occurs constantly. The problem has not changed since onset.Pertinent negatives include no chest pain, no abdominal pain and no headaches. Nothing aggravates the symptoms. Nothing relieves the symptoms. He has tried nothing for the symptoms. The treatment provided no relief.    Past Medical History:  Diagnosis Date  . Epilepsy (HCC)   . Renal disorder    Kidney Stone 2008  . Seizures (HCC)     There are no active problems to display for this patient.   History reviewed. No pertinent surgical history.      Home Medications    Prior to Admission medications   Medication Sig Start Date End Date Taking? Authorizing Provider  acetaminophen (TYLENOL) 500 MG tablet Take 1,000 mg by mouth every 6 (six) hours as needed for moderate pain.    [provider]  HYDROcodone-acetaminophen (NORCO/VICODIN) 5-325 MG tablet Take 1 tablet by mouth every 6 (six) hours as needed for moderate pain.    [provider]  naproxen (NAPROSYN) 500 MG tablet Take 500 mg by mouth 2 (two) times daily with a meal.    [provider]  oxyCODONE-acetaminophen (PERCOCET/ROXICET) 5-325 MG tablet Take 1 tablet by mouth every 4 (four) hours as needed. 04/11/18   Burgess Amor, PA-C  traMADol (ULTRAM) 50 MG tablet Take 1 tablet (50 mg total) by mouth every 6 (six) hours as needed. Patient not taking: Reported on 05/09/2015 03/21/15   [name], NP    Family History Family History  Problem Relation Age of  Onset  . Heart failure Mother   . Osteoarthritis Mother   . Hypertension Mother   . Diabetes Father   . Osteoarthritis Father   . Hypertension Father     Social History Social History   Tobacco Use  . Smoking status: Never Smoker  . Smokeless tobacco: Never Used  Substance Use Topics  . Alcohol use: No  . Drug use: No     Allergies   Aspirin   Review of Systems Review of Systems  Constitutional: Negative for appetite change and fatigue.  HENT: Negative for congestion, ear discharge and sinus pressure.   Eyes: Positive for pain. Negative for discharge.  Respiratory: Negative for cough.   Cardiovascular: Negative for chest pain.  Gastrointestinal: Negative for abdominal pain and diarrhea.  Genitourinary: Negative for frequency and hematuria.  Musculoskeletal: Negative for back pain.  Skin: Negative for rash.  Neurological: Negative for seizures and headaches.  Psychiatric/Behavioral: Negative for hallucinations.     Physical Exam Updated Vital Signs BP (!) 134/94 (BP Location: Left Arm)   Pulse 94   Temp 98.1 F (36.7 C) (Oral)   Resp 18   Ht 5\' 7"  (1.702 m)   Wt 113.4 kg   SpO2 99%   BMI 39.16 kg/m   Physical Exam Vitals signs and nursing note reviewed.  Constitutional:      Appearance: He is well-developed.  HENT:  Head: Normocephalic.     Nose: Nose normal.  Eyes:     General: No scleral icterus.    Comments: Left eye conjunctiva inflamed no foreign body seen.  Pupil equal and reactive,   fluorescein negative  Neck:     Musculoskeletal: Neck supple.     Thyroid: No thyromegaly.  Cardiovascular:     Rate and Rhythm: Normal rate and regular rhythm.     Heart sounds: No murmur. No friction rub. No gallop.   Pulmonary:     Breath sounds: No stridor. No wheezing or rales.  Chest:     Chest wall: No tenderness.  Abdominal:     General: There is no distension.     Tenderness: There is no abdominal tenderness. There is no rebound.   Musculoskeletal: Normal range of motion.  Lymphadenopathy:     Cervical: No cervical adenopathy.  Skin:    Findings: No erythema or rash.  Neurological:     Mental Status: He is oriented to person, place, and time.     Motor: No abnormal muscle tone.     Coordination: Coordination normal.  Psychiatric:        Behavior: Behavior normal.      ED Treatments / Results  Labs (all labs ordered are listed, but only abnormal results are displayed) Labs Reviewed - No data to display  EKG None  Radiology No results found.  Procedures Procedures (including critical care time)  Medications Ordered in ED Medications  tetracaine (PONTOCAINE) 0.5 % ophthalmic solution (has no administration in time range)  fluorescein 1 MG ophthalmic strip (has no administration in time range)     Initial Impression / Assessment and Plan / ED Course  I have reviewed the triage vital signs and the nursing notes.  Pertinent labs & imaging results that were available during my care of the patient were reviewed by me and considered in my medical decision making (see chart for details).    Patient with possible foreign body in left eye.  Symptoms improved with irrigation and tetracaine.  Patient is given some Cipro drops and will follow-up with ophthalmology in the morning or tomorrow if any problems      Final Clinical Impressions(s) / ED Diagnoses   Final diagnoses:  Left eye pain    ED Discharge Orders    None       Bethann BerkshireZammit, Coulter Oldaker, MD 11/20/18 [phone]

## 2018-11-20 NOTE — ED Triage Notes (Signed)
Pt states he was "working under a trailer when someone hit the trailer and rust fell into my left eye. Left eye is swollen and red.

## 2018-11-20 NOTE — Discharge Instructions (Addendum)
Follow-up with Dr. Allena Katz today or tomorrow if your eye is not 100% better in the morning

## 2018-11-20 NOTE — ED Notes (Signed)
Attempted to flush left eye using morgan lens; pt unable to tolerate, Dr. Estell Harpin notified

## 2018-11-21 DIAGNOSIS — H53142 Visual discomfort, left eye: Secondary | ICD-10-CM | POA: Diagnosis not present

## 2018-11-21 DIAGNOSIS — H11432 Conjunctival hyperemia, left eye: Secondary | ICD-10-CM | POA: Diagnosis not present

## 2018-11-21 DIAGNOSIS — H5712 Ocular pain, left eye: Secondary | ICD-10-CM | POA: Diagnosis not present

## 2018-11-21 DIAGNOSIS — T1502XA Foreign body in cornea, left eye, initial encounter: Secondary | ICD-10-CM | POA: Diagnosis not present

## 2018-11-21 DIAGNOSIS — H20042 Secondary noninfectious iridocyclitis, left eye: Secondary | ICD-10-CM | POA: Diagnosis not present

## 2018-11-29 DIAGNOSIS — H11432 Conjunctival hyperemia, left eye: Secondary | ICD-10-CM | POA: Diagnosis not present

## 2018-11-29 DIAGNOSIS — H5712 Ocular pain, left eye: Secondary | ICD-10-CM | POA: Diagnosis not present

## 2018-11-29 DIAGNOSIS — H20042 Secondary noninfectious iridocyclitis, left eye: Secondary | ICD-10-CM | POA: Diagnosis not present

## 2018-11-29 DIAGNOSIS — T1502XD Foreign body in cornea, left eye, subsequent encounter: Secondary | ICD-10-CM | POA: Diagnosis not present

## 2018-12-17 DIAGNOSIS — T1502XD Foreign body in cornea, left eye, subsequent encounter: Secondary | ICD-10-CM | POA: Diagnosis not present

## 2018-12-17 DIAGNOSIS — H53142 Visual discomfort, left eye: Secondary | ICD-10-CM | POA: Diagnosis not present

## 2018-12-17 DIAGNOSIS — H11432 Conjunctival hyperemia, left eye: Secondary | ICD-10-CM | POA: Diagnosis not present

## 2019-05-08 ENCOUNTER — Other Ambulatory Visit: Payer: Self-pay | Admitting: *Deleted

## 2019-05-08 DIAGNOSIS — Z20822 Contact with and (suspected) exposure to covid-19: Secondary | ICD-10-CM

## 2019-05-10 LAB — NOVEL CORONAVIRUS, NAA: SARS-CoV-2, NAA: NOT DETECTED

## 2019-06-07 ENCOUNTER — Emergency Department (HOSPITAL_COMMUNITY)
Admission: EM | Admit: 2019-06-07 | Discharge: 2019-06-07 | Disposition: A | Discharge status: Home / Self Care | Payer: Medicaid Other | Attending: Emergency Medicine | Admitting: Emergency Medicine

## 2019-06-07 ENCOUNTER — Other Ambulatory Visit: Payer: Self-pay

## 2019-06-07 ENCOUNTER — Encounter (HOSPITAL_COMMUNITY): Payer: Self-pay | Admitting: Emergency Medicine

## 2019-06-07 DIAGNOSIS — Z79899 Other long term (current) drug therapy: Secondary | ICD-10-CM | POA: Diagnosis not present

## 2019-06-07 DIAGNOSIS — R220 Localized swelling, mass and lump, head: Secondary | ICD-10-CM | POA: Diagnosis present

## 2019-06-07 DIAGNOSIS — K047 Periapical abscess without sinus: Secondary | ICD-10-CM | POA: Diagnosis not present

## 2019-06-07 MED ORDER — CLINDAMYCIN HCL 300 MG PO CAPS: 300.0000 mg | ORAL_CAPSULE | Freq: Three times a day (TID) | ORAL | 0 refills | Status: DC

## 2019-06-07 MED ORDER — HYDROCODONE-ACETAMINOPHEN 5-325 MG PO TABS: ORAL_TABLET | ORAL | 0 refills | Status: DC

## 2019-06-07 MED ORDER — OXYCODONE-ACETAMINOPHEN 5-325 MG PO TABS
1.0000 | ORAL_TABLET | Freq: Once | ORAL | Status: AC
  Administered 2019-06-07: 1 via ORAL
  Filled 2019-06-07 (×2): qty 1

## 2019-06-07 MED ORDER — CLINDAMYCIN PHOSPHATE 600 MG/50ML IV SOLN
600.0000 mg | Freq: Once | INTRAVENOUS | Status: AC
  Administered 2019-06-07: 600 mg via INTRAVENOUS
  Filled 2019-06-07: qty 50

## 2019-06-07 NOTE — Discharge Instructions (Addendum)
Take the antibiotic as directed until its finished.  Follow-up with your dentist or the dentist listed above.

## 2019-06-07 NOTE — ED Provider Notes (Signed)
[facility] EMERGENCY DEPARTMENT Provider Note   CSN: [id number] Arrival date & time: 06/07/19  1709     History   Chief Complaint Chief Complaint  Patient presents with  . Facial Swelling    HPI Carl Ball is a 33 y.o. male.     HPI   Carl Ball is a 33 y.o. male who presents to the Emergency Department complaining of right-sided dental pain for 2 days.  He reports history of multiple dental caries with gradually worsening pain of one of his right upper teeth.  He states that he woke this morning with swelling of his right face.  He states that he was attempting to have several teeth extracted, but was unable to have the procedure due to Covid.  He denies fever, chills, neck pain, difficulty swallowing or breathing or opening and closing his mouth.  He has used over-the-counter Orajel with minimal relief.  Pain is worse with attempted chewing on the right side.   Past Medical History:  Diagnosis Date  . Epilepsy (HCC)   . Renal disorder    Kidney Stone 2008  . Seizures (HCC)     There are no active problems to display for this patient.   History reviewed. No pertinent surgical history.      Home Medications    Prior to Admission medications   Medication Sig Start Date End Date Taking? Authorizing Provider  acetaminophen (TYLENOL) 500 MG tablet Take 1,000 mg by mouth every 6 (six) hours as needed for moderate pain.    [provider]  HYDROcodone-acetaminophen (NORCO/VICODIN) 5-325 MG tablet Take 1 tablet by mouth every 6 (six) hours as needed for moderate pain.    [provider]  naproxen (NAPROSYN) 500 MG tablet Take 500 mg by mouth 2 (two) times daily with a meal.    [provider]  oxyCODONE-acetaminophen (PERCOCET/ROXICET) 5-325 MG tablet Take 1 tablet by mouth every 4 (four) hours as needed. 04/11/18   Burgess Amor, PA-C  traMADol (ULTRAM) 50 MG tablet Take 1 tablet (50 mg total) by mouth every 6 (six) hours as needed.  Patient not taking: Reported on 05/09/2015 03/21/15   [name], NP    Family History Family History  Problem Relation Age of Onset  . Heart failure Mother   . Osteoarthritis Mother   . Hypertension Mother   . Diabetes Father   . Osteoarthritis Father   . Hypertension Father     Social History Social History   Tobacco Use  . Smoking status: Never Smoker  . Smokeless tobacco: Never Used  Substance Use Topics  . Alcohol use: Yes  . Drug use: No     Allergies   Aspirin   Review of Systems Review of Systems  Constitutional: Negative for appetite change and fever.  HENT: Positive for dental problem and facial swelling. Negative for congestion, sore throat and trouble swallowing.   Eyes: Negative for pain and visual disturbance.  Respiratory: Negative for cough and shortness of breath.   Gastrointestinal: Negative for nausea and vomiting.  Musculoskeletal: Negative for neck pain and neck stiffness.  Skin: Negative for color change and rash.  Neurological: Negative for dizziness and headaches.  Hematological: Negative for adenopathy.     Physical Exam Updated Vital Signs BP (!) 137/95 (BP Location: Right Arm)   Pulse (!) 113   Temp 98.7 F (37.1 C) (Oral)   Resp 18   Ht 5\' 7"  (1.702 m)   Wt 117.9 kg  SpO2 96%   BMI 40.72 kg/m   Physical Exam Vitals signs and nursing note reviewed.  Constitutional:      General: He is not in acute distress.    Appearance: Normal appearance. He is well-developed. He is not ill-appearing.  HENT:     Head:     Jaw: No trismus.     Right Ear: Tympanic membrane and ear canal normal.     Left Ear: Tympanic membrane and ear canal normal.     Mouth/Throat:     Mouth: Mucous membranes are moist.     Dentition: Dental caries present. No dental abscesses.     Pharynx: Uvula midline. No uvula swelling.     Comments: Multiple dental caries with tenderness to palpation of the right upper premolar.  No erythema or fluctuance to the  surrounding gingiva.  No trismus.  Uvula is midline and nonedematous.  Mild to moderate edema of the right face is present. Neck:     Musculoskeletal: Normal range of motion and neck supple.  Cardiovascular:     Rate and Rhythm: Normal rate and regular rhythm.     Heart sounds: Normal heart sounds. No murmur.  Pulmonary:     Effort: Pulmonary effort is normal.     Breath sounds: Normal breath sounds.  Musculoskeletal: Normal range of motion.  Lymphadenopathy:     Cervical: No cervical adenopathy.  Skin:    General: Skin is warm.  Neurological:     Mental Status: He is alert. Mental status is at baseline.     Sensory: No sensory deficit.     Motor: No weakness or abnormal muscle tone.      ED Treatments / Results  Labs (all labs ordered are listed, but only abnormal results are displayed) Labs Reviewed - No data to display  EKG None  Radiology No results found.  Procedures Procedures (including critical care time)  Medications Ordered in ED Medications  clindamycin (CLEOCIN) IVPB 600 mg (600 mg Intravenous New Bag/Given 06/07/19 1943)  oxyCODONE-acetaminophen (PERCOCET/ROXICET) 5-325 MG per tablet 1 tablet (0 tablets Oral Hold 06/07/19 1944)     Initial Impression / Assessment and Plan / ED Course  I have reviewed the triage vital signs and the nursing notes.  Pertinent labs & imaging results that were available during my care of the patient were reviewed by me and considered in my medical decision making (see chart for details).       Patient with likely periapical dental abscess.  No drainable abscess noted at this time.  Airway is patent.  No trismus or concerning symptoms for Ludewig's angina.  No edema or tenderness of the neck.  Patient given IV clindamycin here, will be started on antibiotics.  He appears appropriate for discharge home and he agrees to follow-up with his dentist next week.  Return precautions were discussed.   Final Clinical Impressions(s)  / ED Diagnoses   Final diagnoses:  None    ED Discharge Orders    None       Kem Parkinson, PA-C 06/08/19 1340    Daleen Bo, MD 06/10/19 [phone]

## 2019-06-07 NOTE — ED Triage Notes (Signed)
Patient reports R sided dental pain that started Wednesday. Today patient woke up with R sided facial swelling.

## 2019-07-28 ENCOUNTER — Other Ambulatory Visit: Payer: Self-pay

## 2019-07-28 ENCOUNTER — Emergency Department (HOSPITAL_COMMUNITY)
Admission: EM | Admit: 2019-07-28 | Discharge: 2019-07-29 | Disposition: A | Discharge status: Home / Self Care | Payer: Medicaid Other | Attending: Emergency Medicine | Admitting: Emergency Medicine

## 2019-07-28 ENCOUNTER — Encounter (HOSPITAL_COMMUNITY): Payer: Self-pay | Admitting: Emergency Medicine

## 2019-07-28 DIAGNOSIS — R1011 Right upper quadrant pain: Secondary | ICD-10-CM | POA: Insufficient documentation

## 2019-07-28 DIAGNOSIS — G40909 Epilepsy, unspecified, not intractable, without status epilepticus: Secondary | ICD-10-CM | POA: Diagnosis not present

## 2019-07-28 DIAGNOSIS — Z79899 Other long term (current) drug therapy: Secondary | ICD-10-CM | POA: Diagnosis not present

## 2019-07-28 DIAGNOSIS — Z886 Allergy status to analgesic agent status: Secondary | ICD-10-CM | POA: Insufficient documentation

## 2019-07-28 LAB — CBC
HCT: 43.5 % (ref 39.0–52.0)
Hemoglobin: 14.2 g/dL (ref 13.0–17.0)
MCH: 30.9 pg (ref 26.0–34.0)
MCHC: 32.6 g/dL (ref 30.0–36.0)
MCV: 94.6 fL (ref 80.0–100.0)
Platelets: 268 10*3/uL (ref 150–400)
RBC: 4.6 MIL/uL (ref 4.22–5.81)
RDW: 12.2 % (ref 11.5–15.5)
WBC: 8.2 10*3/uL (ref 4.0–10.5)
nRBC: 0 % (ref 0.0–0.2)

## 2019-07-28 LAB — COMPREHENSIVE METABOLIC PANEL
ALT: 37 U/L (ref 0–44)
AST: 24 U/L (ref 15–41)
Albumin: 4.1 g/dL (ref 3.5–5.0)
Alkaline Phosphatase: 92 U/L (ref 38–126)
Anion gap: 6 (ref 5–15)
BUN: 9 mg/dL (ref 6–20)
CO2: 28 mmol/L (ref 22–32)
Calcium: 8.8 mg/dL — ABNORMAL LOW (ref 8.9–10.3)
Chloride: 108 mmol/L (ref 98–111)
Creatinine, Ser: 0.91 mg/dL (ref 0.61–1.24)
GFR calc Af Amer: >60 mL/min (ref >60)
GFR calc non Af Amer: >60 mL/min (ref >60)
Glucose, Bld: 117 mg/dL — ABNORMAL HIGH (ref 70–99)
Potassium: 4.2 mmol/L (ref 3.5–5.1)
Sodium: 142 mmol/L (ref 135–145)
Total Bilirubin: 1.4 mg/dL — ABNORMAL HIGH (ref 0.3–1.2)
Total Protein: 6.6 g/dL (ref 6.5–8.1)

## 2019-07-28 LAB — LIPASE, BLOOD: Lipase: 43 U/L (ref 11–51)

## 2019-07-28 MED ORDER — ACETAMINOPHEN 500 MG PO TABS
1000.0000 mg | ORAL_TABLET | Freq: Once | ORAL | Status: AC
  Administered 2019-07-29: 1000 mg via ORAL
  Filled 2019-07-28: qty 2

## 2019-07-28 MED ORDER — SODIUM CHLORIDE 0.9% FLUSH: 3.0000 mL | Freq: Once | INTRAVENOUS | Status: DC

## 2019-07-28 NOTE — ED Provider Notes (Addendum)
[facility] EMERGENCY DEPARTMENT Provider Note   CSN: [id number] Arrival date & time: 07/28/19  2238   Time seen 11:09 PM  History Chief Complaint  Patient presents with  . Abdominal Pain    Carl Ball is a 34 y.o. male.  HPI   Patient states he ate some Janine Limbo for dinner tonight.  About 30 to 40 minutes prior to arrival he had acute onset of periumbilical pain that radiates into his right upper quadrant.  He states walking makes the pain worse, laying still makes it feel better.  He denies nausea, vomiting, or diarrhea or fever.  He has never had this pain before.  He has had kidney stones in the past and states this pain is similar however the location is not typical for his kidney stones, he denies any pain in his flank or back tonight or over the past few days.  Patient has a history of seizure disorder that he controls by diet.  He states his last seizure was in 2014, he states he avoids sugars and candy.  PCP Sinda Du, MD   Past Medical History:  Diagnosis Date  . Epilepsy ([place])   . Renal disorder    Kidney Stone 2008  . Seizures ([place])     There are no problems to display for this patient.   History reviewed. No pertinent surgical history.     Family History  Problem Relation Age of Onset  . Heart failure Mother   . Osteoarthritis Mother   . Hypertension Mother   . Diabetes Father   . Osteoarthritis Father   . Hypertension Father     Social History   Tobacco Use  . Smoking status: Never Smoker  . Smokeless tobacco: Never Used  Substance Use Topics  . Alcohol use: Yes  . Drug use: No    Home Medications Prior to Admission medications   Medication Sig Start Date End Date Taking? Authorizing Provider  acetaminophen (TYLENOL) 500 MG tablet Take 1,000 mg by mouth every 6 (six) hours as needed for moderate pain.    [provider]  clindamycin (CLEOCIN) 300 MG capsule Take 1 capsule (300 mg total) by mouth 3 (three) times daily.  06/07/19   Triplett, Tammy, PA-C  HYDROcodone-acetaminophen (NORCO/VICODIN) 5-325 MG tablet Take one tab po q 4 hrs prn pain 06/07/19   Triplett, Tammy, PA-C  naproxen (NAPROSYN) 500 MG tablet Take 500 mg by mouth 2 (two) times daily with a meal.    [provider]  oxyCODONE-acetaminophen (PERCOCET/ROXICET) 5-325 MG tablet Take 1 tablet by mouth every 4 (four) hours as needed. 04/11/18   Evalee Jefferson, PA-C  traMADol (ULTRAM) 50 MG tablet Take 1 tablet (50 mg total) by mouth every 6 (six) hours as needed. Patient not taking: Reported on 05/09/2015 03/21/15   Ashley Murrain, NP    Allergies    Aspirin  Review of Systems   Review of Systems  All other systems reviewed and are negative.   Physical Exam Updated Vital Signs BP 113/85   Pulse 67   Temp 97.6 F (36.4 C) (Oral)   Resp 17   Ht 5\' 8"  (1.727 m)   Wt 117.9 kg   SpO2 96%   BMI 39.53 kg/m   Physical Exam Vitals and nursing note reviewed.  Constitutional:      Appearance: Normal appearance. He is normal weight.  HENT:     Head: Normocephalic and atraumatic.     Right Ear: External ear normal.  Left Ear: External ear normal.     Nose: Nose normal.     Mouth/Throat:     Mouth: Mucous membranes are dry.  Eyes:     Extraocular Movements: Extraocular movements intact.     Conjunctiva/sclera: Conjunctivae normal.     Pupils: Pupils are equal, round, and reactive to light.  Cardiovascular:     Rate and Rhythm: Normal rate and regular rhythm.  Pulmonary:     Effort: Pulmonary effort is normal. No respiratory distress.  Abdominal:     General: Bowel sounds are normal.     Palpations: Abdomen is soft.     Tenderness: There is abdominal tenderness in the right upper quadrant, epigastric area and periumbilical area. There is no guarding or rebound.    Musculoskeletal:        General: Normal range of motion.     Cervical back: Normal range of motion and neck supple.  Skin:    General: Skin is warm and dry.    Neurological:     General: No focal deficit present.     Mental Status: He is alert and oriented to person, place, and time.     Cranial Nerves: No cranial nerve deficit.  Psychiatric:        Mood and Affect: Mood normal.        Behavior: Behavior normal.        Thought Content: Thought content normal.     ED Results / Procedures / Treatments   Labs (all labs ordered are listed, but only abnormal results are displayed)  Results for orders placed or performed during the hospital encounter of 07/28/19  Lipase, blood  Result Value Ref Range   Lipase 43 11 - 51 U/L  Comprehensive metabolic panel  Result Value Ref Range   Sodium 142 135 - 145 mmol/L   Potassium 4.2 3.5 - 5.1 mmol/L   Chloride 108 98 - 111 mmol/L   CO2 28 22 - 32 mmol/L   Glucose, Bld 117 (H) 70 - 99 mg/dL   BUN 9 6 - 20 mg/dL   Creatinine, Ser 1.69 0.61 - 1.24 mg/dL   Calcium 8.8 (L) 8.9 - 10.3 mg/dL   Total Protein 6.6 6.5 - 8.1 g/dL   Albumin 4.1 3.5 - 5.0 g/dL   AST 24 15 - 41 U/L   ALT 37 0 - 44 U/L   Alkaline Phosphatase 92 38 - 126 U/L   Total Bilirubin 1.4 (H) 0.3 - 1.2 mg/dL   GFR calc non Af Amer >60 >60 mL/min   GFR calc Af Amer >60 >60 mL/min   Anion gap 6 5 - 15  CBC  Result Value Ref Range   WBC 8.2 4.0 - 10.5 K/uL   RBC 4.60 4.22 - 5.81 MIL/uL   Hemoglobin 14.2 13.0 - 17.0 g/dL   HCT 45.0 38.8 - 82.8 %   MCV 94.6 80.0 - 100.0 fL   MCH 30.9 26.0 - 34.0 pg   MCHC 32.6 30.0 - 36.0 g/dL   RDW 00.3 49.1 - 79.1 %   Platelets 268 150 - 400 K/uL   nRBC 0.0 0.0 - 0.2 %    Laboratory interpretation all normal except nonfasting hyperglycemia    EKG None  Radiology No results found.   CT renal April 11, 2018 IMPRESSION: 1. A 2 mm distal right ureteral calculus with mild right hydronephrosis. 2. Cholelithiasis.   Electronically Signed   By: Elgie Collard M.D.   On: 04/11/2018 01:29  Procedures  Procedures (including critical care time)  Medications Ordered in  ED Medications  sodium chloride flush (NS) 0.9 % injection 3 mL (3 mLs Intravenous Not Given 07/29/19 0005)  acetaminophen (TYLENOL) tablet 1,000 mg (1,000 mg Oral Given 07/29/19 0132)    ED Course  I have reviewed the triage vital signs and the nursing notes.  Pertinent labs & imaging results that were available during my care of the patient were reviewed by me and considered in my medical decision making (see chart for details).    MDM Rules/Calculators/A&P                      We discussed putting in an IV and getting IV pain and nausea medicine however patient states he does not want to do that.  He states he is not nauseated and he is thirsty and wants to drink fluids.  He is asked that he not get anything strong for pain, he states last time he was here he was given morphine when he had a kidney stone and he was unable to walk afterwards.  He is requesting acetaminophen for his pain.  Recheck at 1:15 AM patient has not gotten his medications yet.  Recheck at 2:50 AM patient is sleeping.  He states his pain is better.  We discussed his laboratory tests which were normal, no elevation of the white count or LFTs to suggest he is having cholecystitis.  We did discuss coming back to get outpatient ultrasound and he is agreeable.  Final Clinical Impression(s) / ED Diagnoses Final diagnoses:  RUQ pain    Rx / DC Orders ED Discharge Orders         Ordered    US Abdomen Limited RUQ/Gall Gladder     07/29/19 0322          Plan discharge  Devoria Albe, MD, Concha Pyo, MD 07/29/19 6468    Devoria Albe, MD 07/29/19 [id number]

## 2019-07-28 NOTE — ED Triage Notes (Signed)
Patient states abdominal pain that radiates to the right side and flank area. Patient denies any nausea or vomiting. Patient states this started about 30 minutes ago. Patient has a hx of kidney stones but states this pain feels different.

## 2019-07-29 NOTE — Discharge Instructions (Addendum)
Try to avoid fried or high fat foods.  This could make you have a gallbladder attack.  Please call the phone number to get an outpatient ultrasound of your gallbladder done.  Your laboratory test tonight looked good.  Return to the ED if you get fever, severe pain or have uncontrolled vomiting.  You might consider talking to a surgeon about having your gallbladder removed if you continue to have problems eating foods that make your stomach hurt.

## 2019-07-31 ENCOUNTER — Ambulatory Visit: Payer: Medicaid Other | Admitting: Family Medicine

## 2019-08-06 ENCOUNTER — Other Ambulatory Visit: Payer: Self-pay

## 2019-08-06 ENCOUNTER — Ambulatory Visit (INDEPENDENT_AMBULATORY_CARE_PROVIDER_SITE_OTHER): Payer: Medicaid Other | Admitting: Family Medicine

## 2019-08-06 ENCOUNTER — Encounter: Payer: Self-pay | Admitting: Family Medicine

## 2019-08-06 VITALS — BP 112/66 | HR 74 | Temp 97.6°F | Resp 15 | Ht 67.0 in | Wt 253.0 lb

## 2019-08-06 DIAGNOSIS — R1011 Right upper quadrant pain: Secondary | ICD-10-CM

## 2019-08-06 DIAGNOSIS — G40909 Epilepsy, unspecified, not intractable, without status epilepticus: Secondary | ICD-10-CM | POA: Diagnosis not present

## 2019-08-06 DIAGNOSIS — E669 Obesity, unspecified: Secondary | ICD-10-CM | POA: Diagnosis not present

## 2019-08-06 HISTORY — DX: Right upper quadrant pain: R10.11

## 2019-08-06 NOTE — Assessment & Plan Note (Signed)
Reports not taking any medication for this.  Denies having any signs or symptoms of seizure activity recently.  Reports that if he steers clear of sugar and extra candies and sweets he does okay and does not have any problems.  Advised for him to follow-up with a neurologist possibly just to make sure that you are followed yearly.  He says he will think about this.

## 2019-08-06 NOTE — Progress Notes (Signed)
Subjective:  Patient ID: Carl Ball, male    DOB: July 08, 1986  Age: 34 y.o. MRN: [id number]  CC:  Chief Complaint  Patient presents with  . New Patient (Initial Visit)    establish care. Former patient of Dr.Hawkins      HPI  HPI  Carl Ball is a 34 year old male patient who presents today for establishment of care was a former patient of Dr. Juanetta Gosling who is recently retired here in [employer].  He has a significant history that includes epilepsy but is currently not taking any seizure medication.  Kidney stones.  And recent gallbladder issues.  He went to the emergency room this past Sunday and was advised to get a PCP so that he can get ultrasound for gallbladder attack.  You can see note from emergency room in chart review.  He is currently unemployed.  Does not use tobacco does drink occasionally.  Sexually active with significant other.  He has 2 biological children that live in the home and 1 stepchild that lives in the home that he has been with since she was a baby.  He enjoys being outside.  He reports he eats all food groups but he avoids candy and sugar as those are the things that trigger his epilepsy/seizures.  He does report doing [employer] daily though.  Sweet tea and chair wine.  He does drink 2 to 320 ounce bottles of water daily as well.  He has a significant family history of diabetes, heart failure, hypertension, osteoarthritis. Does have allergy to aspirin.  Declined having flu vaccine.  Last tetanus was in January 2020.  He is unsure when his last labs were.  He denies having any other concerns outside of getting his gallbladder looked at.  He denies having any pain or discomfort at this time it seems to have eased off some but he notices it when it comes back certain foods eats.  He loves to eat a lot of hot foods.  Puts hot sauce on almost everything per him.  Today patient denies signs and symptoms of COVID 19 infection including fever,  chills, cough, shortness of breath, and headache. Past Medical, Surgical, Social History, Allergies, and Medications have been Reviewed.   Past Medical History:  Diagnosis Date  . Epilepsy ([facility])   . Renal disorder    Kidney Stone 2008  . Seizures ([facility])     Current Meds  Medication Sig  . acetaminophen (TYLENOL) 500 MG tablet Take 1,000 mg by mouth every 6 (six) hours as needed for moderate pain.    ROS:  Review of Systems  Constitutional: Negative.   HENT: Negative.   Eyes: Negative.   Respiratory: Negative.   Cardiovascular: Negative.   Gastrointestinal: Positive for abdominal pain.  Genitourinary: Negative.   Musculoskeletal: Negative.   Skin: Negative.   Neurological: Negative.   Endo/Heme/Allergies: Negative.   Psychiatric/Behavioral: Negative.   All other systems reviewed and are negative.    Objective:   Today's Vitals: BP 112/66   Pulse 74   Temp 97.6 F (36.4 C) (Oral)   Resp 15   Ht 5\' 7"  (1.702 m)   Wt 253 lb (114.8 kg)   SpO2 97%   BMI 39.63 kg/m  Vitals with BMI 08/06/2019 07/29/2019 07/29/2019  Height 5\' 7"  - -  Weight 253 lbs - -  BMI 39.62 - -  Systolic 112 113 09/26/2019  Diastolic 66 85 80  Pulse 74 67 63  Some encounter information is  confidential and restricted. Go to Review Flowsheets activity to see all data.     Physical Exam Vitals and nursing note reviewed.  Constitutional:      Appearance: Normal appearance. He is well-developed and well-groomed. He is obese.  HENT:     Head: Normocephalic and atraumatic.     Right Ear: External ear normal.     Left Ear: External ear normal.     Nose: Nose normal.     Mouth/Throat:     Mouth: Mucous membranes are moist.     Pharynx: Oropharynx is clear.  Eyes:     General:        Right eye: No discharge.        Left eye: No discharge.     Conjunctiva/sclera: Conjunctivae normal.  Cardiovascular:     Rate and Rhythm: Normal rate and regular rhythm.     Pulses: Normal pulses.     Heart sounds:  Normal heart sounds.  Pulmonary:     Effort: Pulmonary effort is normal.     Breath sounds: Normal breath sounds.  Abdominal:     General: Bowel sounds are normal.     Palpations: Abdomen is soft. There is no shifting dullness, hepatomegaly or splenomegaly.     Tenderness: There is abdominal tenderness in the right upper quadrant. There is no right CVA tenderness, left CVA tenderness, guarding or rebound. Negative signs include Murphy's sign and McBurney's sign.  Musculoskeletal:        General: Normal range of motion.     Cervical back: Normal range of motion and neck supple.  Skin:    General: Skin is warm.  Neurological:     General: No focal deficit present.     Mental Status: He is alert and oriented to person, place, and time.  Psychiatric:        Attention and Perception: Attention normal.        Mood and Affect: Mood normal.        Speech: Speech normal.        Behavior: Behavior normal. Behavior is cooperative.        Thought Content: Thought content normal.        Cognition and Memory: Cognition normal.        Judgment: Judgment normal.      Assessment   1. RUQ pain   2. Seizure disorder ([place])   3. Obesity (BMI 35.0-39.9 without comorbidity)     Tests ordered Orders Placed This Encounter  Procedures  . US Abdomen Limited RUQ     Plan: Please see assessment and plan per problem list above.   No orders of the defined types were placed in this encounter.   Patient to follow-up in 6 months for annual   Perlie Mayo, NP

## 2019-08-06 NOTE — Patient Instructions (Addendum)
Happy New Year! May you have a year filled with hope, love, happiness and laughter.  I appreciate the opportunity to provide you with care for your health and wellness. Today we discussed: establish care  Follow up: 6 months for annual with vision  No labs or referrals today  [place] ordered today  Please continue to practice social distancing to keep you, your family, and our community safe.  If you must go out, please wear a mask and practice good handwashing.  It was a pleasure to see you and I look forward to continuing to work together on your health and well-being. Please do not hesitate to call the office if you need care or have questions about your care.  Have a wonderful day and week. With Gratitude, Tereasa Coop, DNP, AGNP-BC

## 2019-08-06 NOTE — Assessment & Plan Note (Signed)
Emergency room ordered ultrasound needs to be provided through PCP.  I have placed an order for this to check out his gallbladder pending findings will appropriately order or refer.Patient acknowledged agreement and understanding of the plan.

## 2019-08-06 NOTE — Assessment & Plan Note (Signed)
Patient is aware that he is diarrhea.  But he reports that he is happy where he is and he does not feel like he wants to lose any weight at this time.  He eats whatever he wants to eat and as long as he is not having any problems that are caused from his weight he feels that he is doing okay.  He is strongly encouraged to exercise and to maintain a heart healthy low-fat diet.Patient acknowledged agreement and understanding of the plan.

## 2019-08-13 ENCOUNTER — Other Ambulatory Visit: Payer: Self-pay

## 2019-08-13 ENCOUNTER — Ambulatory Visit (HOSPITAL_COMMUNITY)
Admission: RE | Admit: 2019-08-13 | Discharge: 2019-08-13 | Disposition: A | Discharge status: Home / Self Care | Payer: Medicaid Other | Source: Ambulatory Visit | Attending: Family Medicine | Admitting: Family Medicine

## 2019-08-13 DIAGNOSIS — R1011 Right upper quadrant pain: Secondary | ICD-10-CM | POA: Diagnosis not present

## 2019-08-13 DIAGNOSIS — K8020 Calculus of gallbladder without cholecystitis without obstruction: Secondary | ICD-10-CM | POA: Diagnosis not present

## 2020-01-25 ENCOUNTER — Encounter (HOSPITAL_COMMUNITY): Payer: Self-pay | Admitting: *Deleted

## 2020-01-25 ENCOUNTER — Other Ambulatory Visit: Payer: Self-pay

## 2020-01-25 ENCOUNTER — Emergency Department (HOSPITAL_COMMUNITY)
Admission: EM | Admit: 2020-01-25 | Discharge: 2020-01-25 | Disposition: A | Discharge status: Home / Self Care | Payer: Medicaid Other | Attending: Emergency Medicine | Admitting: Emergency Medicine

## 2020-01-25 DIAGNOSIS — L551 Sunburn of second degree: Secondary | ICD-10-CM | POA: Diagnosis not present

## 2020-01-25 MED ORDER — SILVER SULFADIAZINE 1 % EX CREA
TOPICAL_CREAM | Freq: Once | CUTANEOUS | Status: AC
  Administered 2020-01-25: 1 via TOPICAL
  Filled 2020-01-25: qty 50

## 2020-01-25 MED ORDER — HYDROCODONE-ACETAMINOPHEN 5-325 MG PO TABS: 1.0000 | ORAL_TABLET | ORAL | 0 refills | Status: DC | PRN

## 2020-01-25 MED ORDER — IBUPROFEN 800 MG PO TABS: 800.0000 mg | ORAL_TABLET | Freq: Three times a day (TID) | ORAL | 0 refills | Status: DC

## 2020-01-25 MED ORDER — SILVER SULFADIAZINE 1 % EX CREA: 1.0000 "application " | TOPICAL_CREAM | Freq: Two times a day (BID) | CUTANEOUS | 0 refills | Status: DC

## 2020-01-25 NOTE — Discharge Instructions (Signed)
Apply the Silvadene cream to the blistered areas of your shoulders twice daily after gently washing the old cream off with soap and water. Ibuprofen may help with pain. Continue taking your Benadryl. You may continue using the aloe vera on the other areas of burn. Do not drive within 4 hours of taking hydrocodone as this medication may make you drowsy but you may use this if needed for severe pain relief. Plan to see your doctor for recheck if your symptoms persist or worsen. 
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
## 2020-02-04 ENCOUNTER — Ambulatory Visit (INDEPENDENT_AMBULATORY_CARE_PROVIDER_SITE_OTHER): Payer: Medicaid Other | Admitting: Family Medicine

## 2020-02-04 ENCOUNTER — Other Ambulatory Visit: Payer: Self-pay

## 2020-02-04 ENCOUNTER — Encounter: Payer: Self-pay | Admitting: Family Medicine

## 2020-02-04 VITALS — BP 118/79 | HR 84 | Temp 97.6°F | Resp 18 | Ht 66.0 in | Wt 242.0 lb

## 2020-02-04 DIAGNOSIS — Z0001 Encounter for general adult medical examination with abnormal findings: Secondary | ICD-10-CM | POA: Diagnosis not present

## 2020-02-04 DIAGNOSIS — E669 Obesity, unspecified: Secondary | ICD-10-CM

## 2020-02-04 DIAGNOSIS — K8020 Calculus of gallbladder without cholecystitis without obstruction: Secondary | ICD-10-CM

## 2020-02-04 DIAGNOSIS — H6123 Impacted cerumen, bilateral: Secondary | ICD-10-CM | POA: Insufficient documentation

## 2020-02-04 IMAGING — CT CT RENAL STONE PROTOCOL
2 of 4 series · 16 of 46 positions shown, 18 images · non-contrast
Comparison: CT of the abdomen pelvis dated 01/23/2010

CLINICAL DATA: 32-year-old male with right flank pain.

EXAM:
CT ABDOMEN AND PELVIS WITHOUT CONTRAST
TECHNIQUE: Multidetector CT imaging of the abdomen and pelvis was performed
following the standard protocol without IV contrast.

[Series 2: axial st · axial · 0.98mm/px · z∈[+732,+1212]mm · 13 of 106 slices shown, 15 images]
[im 5/106  soft-tissue]
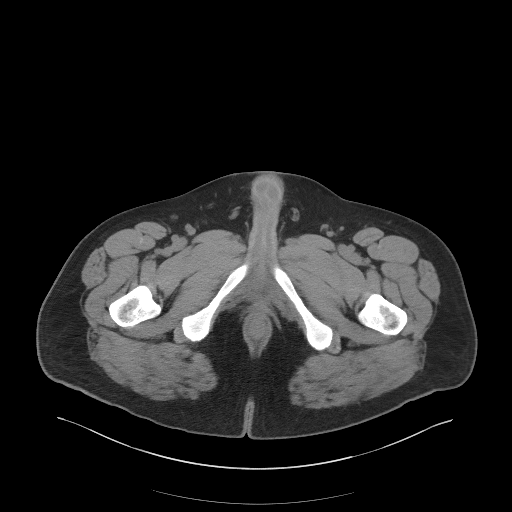
[im 5/106  bone]
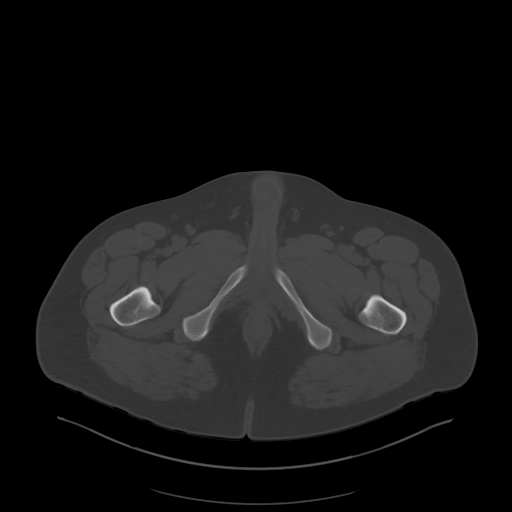
[im 13/106  soft-tissue]
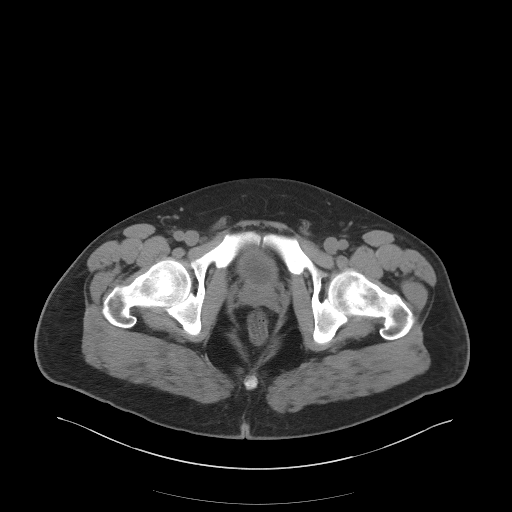
[im 22/106  soft-tissue]
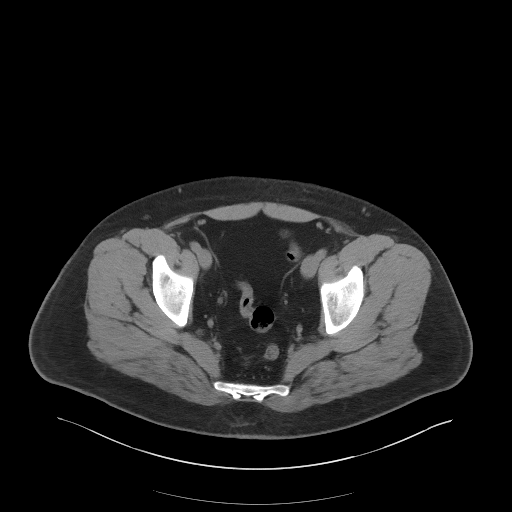
[im 30/106  soft-tissue]
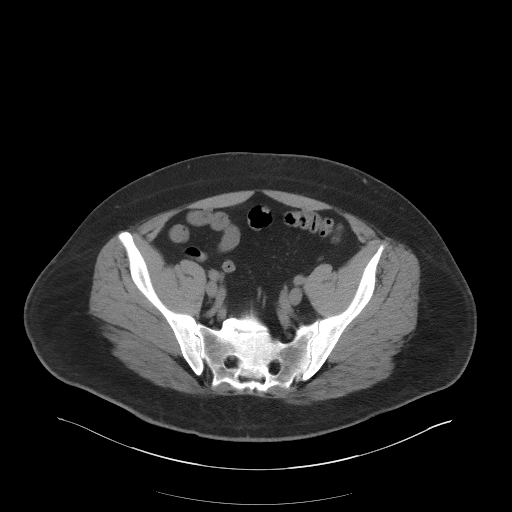
[im 38/106  soft-tissue]
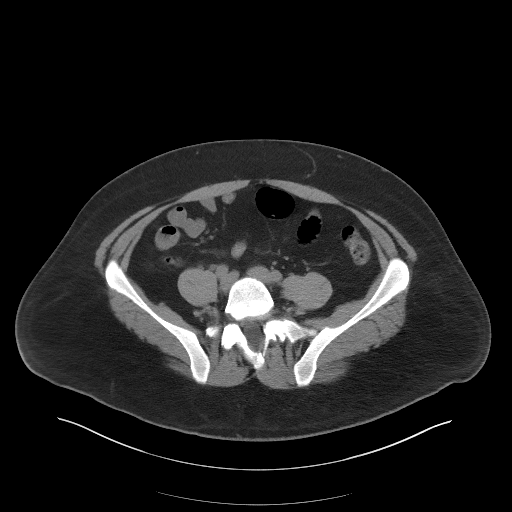
[im 47/106  soft-tissue]
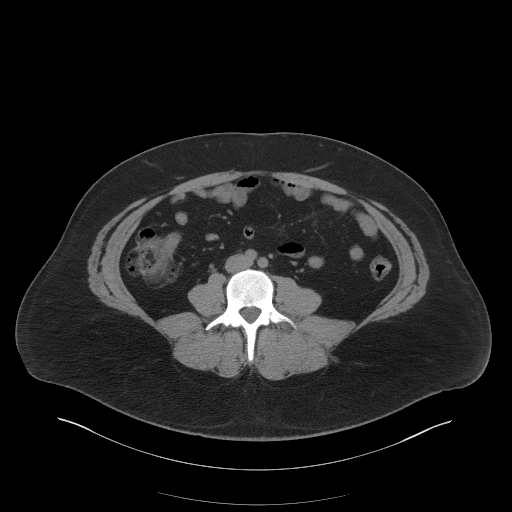
[im 55/106  soft-tissue]
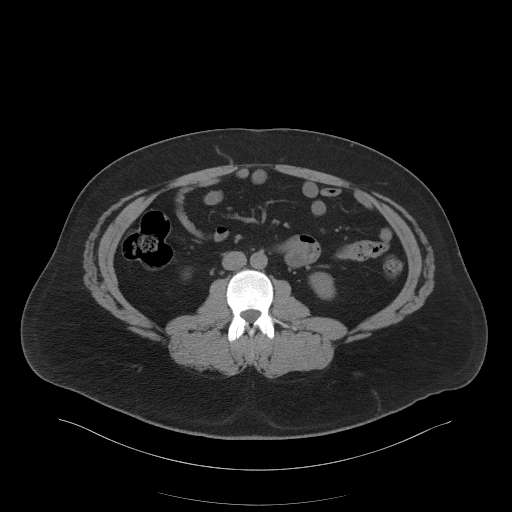
[im 59/106  soft-tissue]
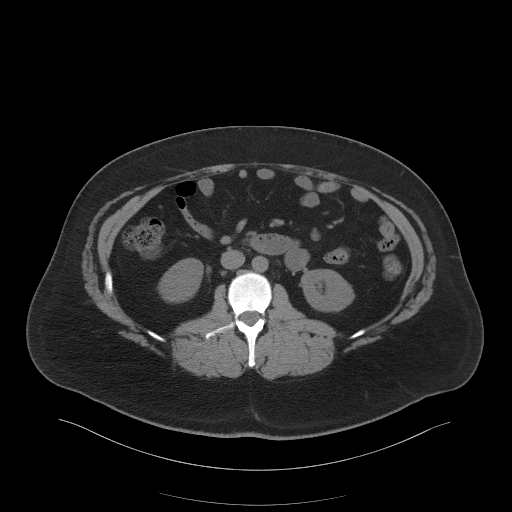
[im 68/106  soft-tissue]
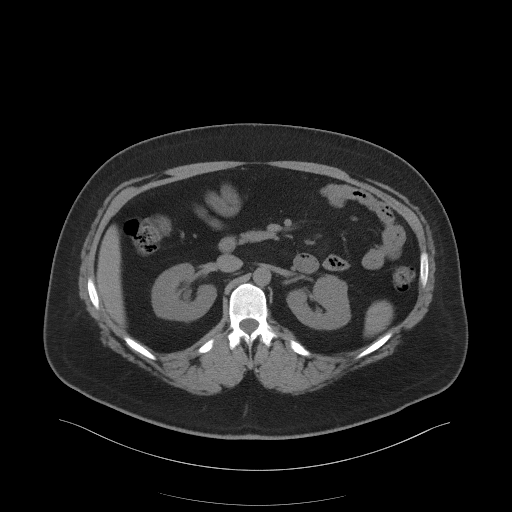
[im 68/106  bone]
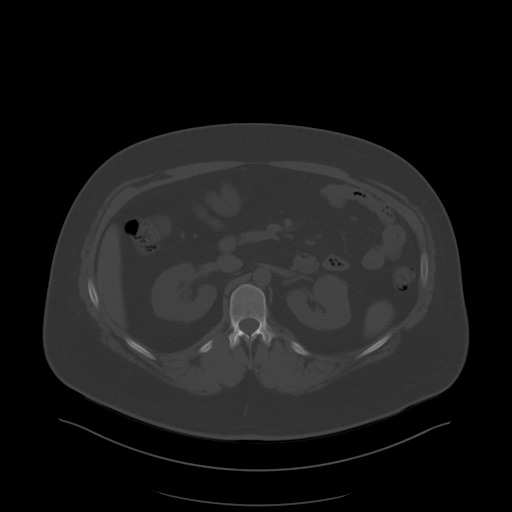
[im 76/106  soft-tissue]
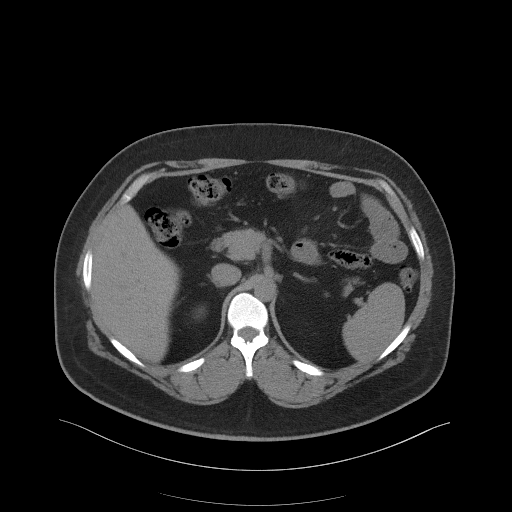
[im 85/106  soft-tissue]
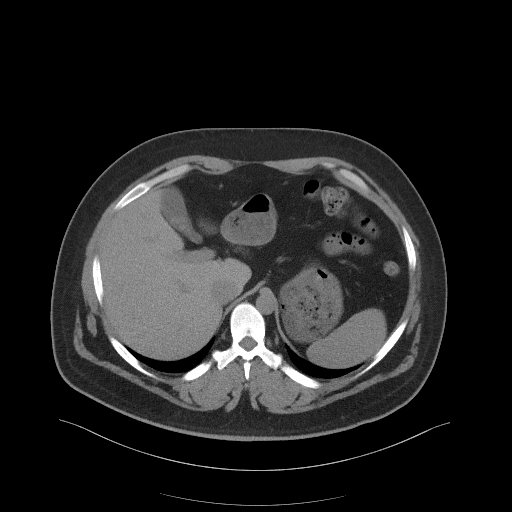
[im 93/106  soft-tissue]
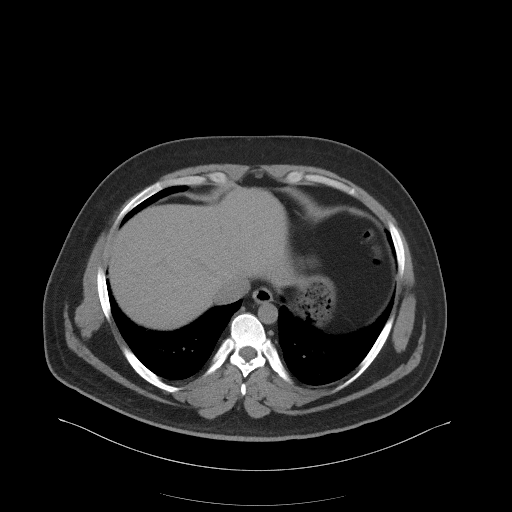
[im 101/106  soft-tissue]
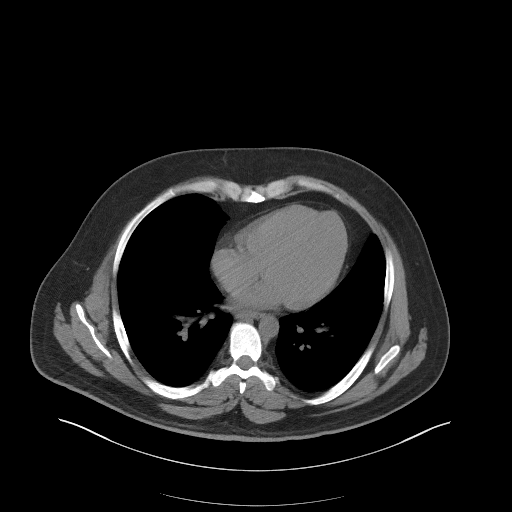

[Series 5: coronal st · coronal · 0.89mm/px · 3 of 91 slices shown]
[im 31/91  soft-tissue]
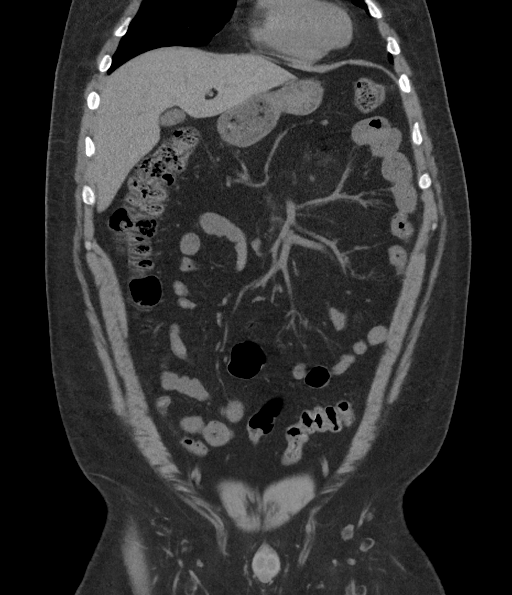
[im 41/91  soft-tissue]
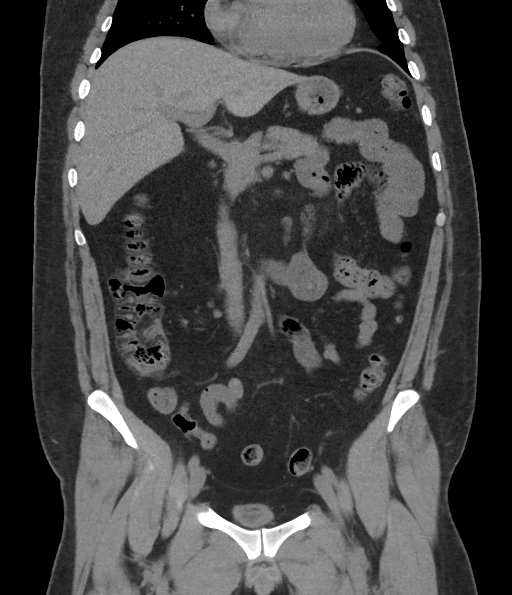
[im 51/91  soft-tissue]
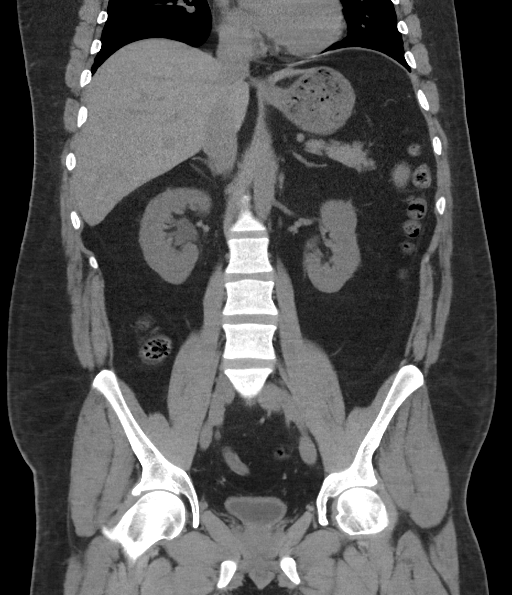

[16 of 46 positions shown; findings below may reference images not displayed]

FINDINGS: Evaluation of this exam is limited in the absence of intravenous
contrast.

Lower chest: The visualized lung bases are clear.

No intra-abdominal free air or free fluid.

Hepatobiliary: The liver is unremarkable. No intrahepatic biliary
ductal dilatation. Multiple small stones noted within the
gallbladder. No pericholecystic fluid.

Pancreas: Unremarkable. No pancreatic ductal dilatation or
surrounding inflammatory changes.

Spleen: Normal in size without focal abnormality.

Adrenals/Urinary Tract: The adrenal glands are unremarkable. There
is a 2 mm distal right ureteral calculus with mild right
hydronephrosis. The left kidney, left ureter, and urinary bladder
appear unremarkable.

Stomach/Bowel: There is no bowel obstruction or active inflammation.
Normal appendix.

Vascular/Lymphatic: The abdominal aorta and IVC are grossly
unremarkable on this noncontrast CT. No portal venous gas. There is
no adenopathy.

Reproductive: The prostate and seminal vesicles are grossly
unremarkable. No pelvic mass.

Other: Small fat containing umbilical hernia

Musculoskeletal: No acute or significant osseous findings.
IMPRESSION: 1. A 2 mm distal right ureteral calculus with mild right
hydronephrosis.
2. Cholelithiasis.

## 2020-02-04 MED ORDER — CARBAMIDE PEROXIDE 6.5 % OT SOLN: 5.0000 [drp] | Freq: Two times a day (BID) | OTIC | 0 refills | Status: DC

## 2020-02-04 NOTE — Assessment & Plan Note (Signed)
Reports doing well, overall working to avoid fried foods, uses air fryer mostly now. Still has some problems, but with diet changes it is controllable.

## 2020-02-04 NOTE — Assessment & Plan Note (Signed)
Improved  Carl Ball is educated about the importance of exercise daily to help with weight management. A minumum of 30 minutes daily is recommended. Additionally, importance of healthy food choices  with portion control discussed.  Wt Readings from Last 3 Encounters:  02/04/20 242 lb (109.8 kg)  01/25/20 250 lb (113.4 kg)  08/06/19 253 lb (114.8 kg)

## 2020-02-04 NOTE — Patient Instructions (Addendum)
I appreciate the opportunity to provide you with care for your health and wellness. Today we discussed: overall health   Follow up: 5-7 days for ear flushing and 1 year for annual   Fasting Labs-today or in one week ([facility]- across from [facility]) No referrals today  Please use Debrox ear drops twice daily until next appt for ear flushing.  Please continue to practice social distancing to keep you, your family, and our community safe.  If you must go out, please wear a mask and practice good handwashing.  It was a pleasure to see you and I look forward to continuing to work together on your health and well-being. Please do not hesitate to call the office if you need care or have questions about your care.  Have a wonderful day and week. With Gratitude, Tereasa Coop, DNP, AGNP-BC  HEALTH MAINTENANCE RECOMMENDATIONS:  It is recommended that you get at least 30 minutes of aerobic exercise at least 5 days/week (for weight loss, you may need as much as 60-90 minutes). This can be any activity that gets your heart rate up. This can be divided in 10-15 minute intervals if needed, but try and build up your endurance at least once a week.  Weight bearing exercise is also recommended twice weekly.  Eat a healthy diet with lots of vegetables, fruits and fiber.  "Colorful" foods have a lot of vitamins (ie green vegetables, tomatoes, red peppers, etc).  Limit sweet tea, regular sodas and alcoholic beverages, all of which has a lot of calories and sugar.  Up to 2 alcoholic drinks daily may be beneficial for men (unless trying to lose weight, watch sugars).  Drink a lot of water.  Sunscreen of at least SPF 30 should be used on all sun-exposed parts of the skin when outside between the hours of 10 am and 4 pm (not just when at beach or pool, but even with exercise, golf, tennis, and yard work!)  Use a sunscreen that says "broad spectrum" so it covers both UVA and UVB rays, and make sure to reapply every  1-2 hours.  Remember to change the batteries in your smoke detectors when changing your clock times in the spring and fall.  Use your seat belt every time you are in a car, and please drive safely and not be distracted with cell phones and texting while driving.

## 2020-02-04 NOTE — Assessment & Plan Note (Signed)
Plan for follow up appt for irrigation in office

## 2020-02-04 NOTE — Assessment & Plan Note (Signed)
Discussed PSA screening (risks/benefits), recommended at least 30 minutes of aerobic activity at least 5 days/week; proper sunscreen use reviewed; healthy diet and alcohol recommendations (less than or equal to 2 drinks/day) reviewed; regular seatbelt use; changing batteries in smoke detectors. Immunization recommendations discussed.  Colonoscopy recommendations reviewed. ° °

## 2020-02-04 NOTE — Progress Notes (Signed)
Health Maintenance reviewed -  Immunization History  Administered Date(s) Administered  . Tdap 08/04/2018   Last colonoscopy: n/a Last PSA: n/a Dentist: getting appt  Ophtho: no eye dr appt  Exercise: active at work  Smoker: no  Alcohol Use: limited  Other doctors caring for patient include:  Patient Care Team: Freddy Finner, NP as PCP - General (Family Medicine)  End of Life Discussion:  Patient does not have a living will and medical power of attorney  Subjective:   HPI  Carl Ball is a 34 y.o. male who presents for annual wellness visit and follow-up on chronic medical conditions.  He has the following concerns:  Denies any trouble sleeping.  Does have some dentition changes and needs to see a dentist.  Otherwise eating and swallowing okay.  Appetite is good.  Denies any trouble with going to the bathroom.  Denies having any blood in urine or stool.  Denies having any falls.  Denies having any memory trouble.  Recovering from sunburn overall doing well with this.  Denies having any vision changes.  Denies any chest pain, headaches, shortness of breath, leg swelling, palpitations, cough fevers or chills.  Reports that his stomach hurts occasionally if he needs to fatty of the product.  Does have gallbladder stones.  Overall doing well not having any issues or concerns to discuss today.   Review Of Systems  Review of Systems  Constitutional: Negative.   HENT: Negative.   Eyes: Negative.   Respiratory: Negative.   Cardiovascular: Negative.   Gastrointestinal: Negative.   Endocrine: Negative.   Genitourinary: Negative.   Musculoskeletal: Negative.   Skin: Negative.   Allergic/Immunologic: Negative.   Neurological: Negative.   Hematological: Negative.   Psychiatric/Behavioral: Negative.   All other systems reviewed and are negative.   Objective:   PHYSICAL EXAM:  BP 118/79 (BP Location: Right Arm, Patient Position: Sitting, Cuff Size: Normal)   Pulse 84    Temp 97.6 F (36.4 C) (Temporal)   Resp 18   Ht 5\' 6"  (1.676 m)   Wt 242 lb (109.8 kg)   SpO2 94%   BMI 39.06 kg/m    Physical Exam Vitals and nursing note reviewed.  Constitutional:      Appearance: Normal appearance. He is well-developed and well-groomed. He is obese.  HENT:     Head: Normocephalic and atraumatic.     Right Ear: Tympanic membrane normal. Decreased hearing noted. There is impacted cerumen.     Left Ear: Tympanic membrane normal. Decreased hearing noted. There is impacted cerumen.     Nose: Nose normal.     Mouth/Throat:     Lips: Pink.     Mouth: Mucous membranes are moist.     Dentition: Abnormal dentition.     Pharynx: Oropharynx is clear. Uvula midline.  Eyes:     General: Lids are normal.        Right eye: No discharge.        Left eye: No discharge.     Extraocular Movements: Extraocular movements intact.     Conjunctiva/sclera: Conjunctivae normal.     Pupils: Pupils are equal, round, and reactive to light.  Neck:     Thyroid: No thyroid mass, thyromegaly or thyroid tenderness.  Cardiovascular:     Rate and Rhythm: Normal rate and regular rhythm.     Pulses: Normal pulses.     Heart sounds: Normal heart sounds.  Pulmonary:     Effort: Pulmonary effort is normal.  Breath sounds: Normal breath sounds and air entry.  Abdominal:     General: Bowel sounds are normal. There is no distension.     Palpations: Abdomen is soft.     Tenderness: There is no abdominal tenderness. There is no right CVA tenderness or left CVA tenderness.  Musculoskeletal:        General: Normal range of motion.     Cervical back: Full passive range of motion without pain, normal range of motion and neck supple.     Right lower leg: No edema.     Left lower leg: No edema.     Comments: MAE, ROM intact throughout   Lymphadenopathy:     Cervical: No cervical adenopathy.  Skin:    General: Skin is warm and dry.     Capillary Refill: Capillary refill takes less than 2  seconds.  Neurological:     General: No focal deficit present.     Mental Status: He is alert and oriented to person, place, and time.     Cranial Nerves: Cranial nerves are intact.     Sensory: Sensation is intact.     Motor: Motor function is intact.     Coordination: Coordination is intact.     Gait: Gait is intact.     Deep Tendon Reflexes: Reflexes are normal and symmetric.  Psychiatric:        Attention and Perception: Attention and perception normal.        Mood and Affect: Mood and affect normal.        Speech: Speech normal.        Behavior: Behavior normal. Behavior is cooperative.        Thought Content: Thought content normal.        Cognition and Memory: Cognition and memory normal.        Judgment: Judgment normal.     Comments: Pleasant in communication, good eye contact     Depression Screening  Depression screen [facility] 2/9 02/04/2020 08/06/2019  Decreased Interest 0 0  Down, Depressed, Hopeless 0 0  PHQ - 2 Score 0 0     Falls  Fall Risk  02/04/2020 08/06/2019  Falls in the past year? 0 0  Number falls in past yr: 0 0  Injury with Fall? 0 0  Risk for fall due to : No Fall Risks -  Follow up Falls evaluation completed -    Assessment & Plan:   1. Annual visit for general adult medical examination with abnormal findings   2. Obesity (BMI 35.0-39.9 without comorbidity)   3. Calculus of gallbladder without cholecystitis without obstruction   4. Bilateral impacted cerumen     Tests ordered Orders Placed This Encounter  Procedures  . CBC  . Comprehensive metabolic panel  . Hemoglobin A1c  . Lipid panel     Plan: Please see assessment and plan per problem list above.   Meds ordered this encounter  Medications  . carbamide peroxide (DEBROX) 6.5 % OTIC solution    Sig: Place 5 drops into both ears 2 (two) times daily. For 7 days    Dispense:  15 mL    Refill:  0    Order Specific Question:   Supervising Provider    Answer:   Syliva Overman E  [[id number]]   I have personally reviewed: The patient's medical and social history Their use of alcohol, tobacco or illicit drugs Their current medications and supplements The patient's functional ability including ADLs,fall risks, home safety risks,  cognitive, and hearing and visual impairment Diet and physical activities Evidence for depression or mood disorders  The patient's weight, height, BMI, and visual acuity have been recorded in the chart.  I have made referrals, counseling, and provided education to the patient based on review of the above and I have provided the patient with a written personalized care plan for preventive services.    Note: This dictation was prepared with Dragon dictation along with smaller phrase technology. Similar sounding words can be transcribed inadequately or may not be corrected upon review. Any transcriptional errors that result from this process are unintentional.      Freddy Finner, NP   02/04/2020

## 2020-02-05 DIAGNOSIS — K8020 Calculus of gallbladder without cholecystitis without obstruction: Secondary | ICD-10-CM | POA: Diagnosis not present

## 2020-02-05 DIAGNOSIS — Z0001 Encounter for general adult medical examination with abnormal findings: Secondary | ICD-10-CM | POA: Diagnosis not present

## 2020-02-05 DIAGNOSIS — E669 Obesity, unspecified: Secondary | ICD-10-CM | POA: Diagnosis not present

## 2020-02-06 LAB — LIPID PANEL
Chol/HDL Ratio: 2.5 ratio (ref 0.0–5.0)
Cholesterol, Total: 111 mg/dL (ref 100–199)
HDL: 44 mg/dL (ref >39)
LDL Chol Calc (NIH): 51 mg/dL (ref 0–99)
Triglycerides: 77 mg/dL (ref 0–149)
VLDL Cholesterol Cal: 16 mg/dL (ref 5–40)

## 2020-02-06 LAB — COMPREHENSIVE METABOLIC PANEL
ALT: 29 IU/L (ref 0–44)
AST: 21 IU/L (ref 0–40)
Albumin/Globulin Ratio: 2 (ref 1.2–2.2)
Albumin: 4.5 g/dL (ref 4.0–5.0)
Alkaline Phosphatase: 139 IU/L — ABNORMAL HIGH (ref 48–121)
BUN/Creatinine Ratio: 12 (ref 9–20)
BUN: 11 mg/dL (ref 6–20)
Bilirubin Total: 1.2 mg/dL (ref 0.0–1.2)
CO2: 25 mmol/L (ref 20–29)
Calcium: 9.3 mg/dL (ref 8.7–10.2)
Chloride: 106 mmol/L (ref 96–106)
Creatinine, Ser: 0.92 mg/dL (ref 0.76–1.27)
GFR calc Af Amer: 127 mL/min/{1.73_m2} (ref >59)
GFR calc non Af Amer: 110 mL/min/{1.73_m2} (ref >59)
Globulin, Total: 2.2 g/dL (ref 1.5–4.5)
Glucose: 87 mg/dL (ref 65–99)
Potassium: 4.4 mmol/L (ref 3.5–5.2)
Sodium: 144 mmol/L (ref 134–144)
Total Protein: 6.7 g/dL (ref 6.0–8.5)

## 2020-02-06 LAB — CBC
Hematocrit: 46.5 % (ref 37.5–51.0)
Hemoglobin: 15.2 g/dL (ref 13.0–17.7)
MCH: 30.8 pg (ref 26.6–33.0)
MCHC: 32.7 g/dL (ref 31.5–35.7)
MCV: 94 fL (ref 79–97)
Platelets: 303 10*3/uL (ref 150–450)
RBC: 4.93 x10E6/uL (ref 4.14–5.80)
RDW: 12.5 % (ref 11.6–15.4)
WBC: 7.2 10*3/uL (ref 3.4–10.8)

## 2020-02-06 LAB — HEMOGLOBIN A1C
Est. average glucose Bld gHb Est-mCnc: 103 mg/dL
Hgb A1c MFr Bld: 5.2 % (ref 4.8–5.6)

## 2020-02-07 NOTE — Progress Notes (Signed)
Pt advised of lab results with verbal understanding  

## 2020-02-12 ENCOUNTER — Ambulatory Visit (INDEPENDENT_AMBULATORY_CARE_PROVIDER_SITE_OTHER): Payer: Medicaid Other | Admitting: Family Medicine

## 2020-02-12 ENCOUNTER — Encounter: Payer: Self-pay | Admitting: Family Medicine

## 2020-02-12 ENCOUNTER — Other Ambulatory Visit: Payer: Self-pay

## 2020-02-12 VITALS — BP 118/78 | HR 75 | Temp 97.2°F | Resp 16 | Ht 67.0 in | Wt 242.0 lb

## 2020-02-12 DIAGNOSIS — R6884 Jaw pain: Secondary | ICD-10-CM

## 2020-02-12 DIAGNOSIS — H6123 Impacted cerumen, bilateral: Secondary | ICD-10-CM | POA: Diagnosis not present

## 2020-02-12 NOTE — Assessment & Plan Note (Signed)
today after flushing his right he reports that he had some traveling pain into his jaw, similar to the pain that he had at home over the last couple days..  It is hurting off and on for the last couple days.  He is open to an ENT referral.  Does not want his left side flushed. ENT referral placed urgently.

## 2020-02-12 NOTE — Progress Notes (Signed)
Subjective:  Patient ID: Carl Ball, male    DOB: 26-Dec-1985  Age: 34 y.o. MRN: [id number]  CC:  Chief Complaint  Patient presents with  . Follow-up    ear flush they are still stopped up now he is having trouble with pain from his ear to jaw swelling and hurting for th elast couple of days      HPI  HPI   Carl Ball is a 34 year old male patient of mine.  He presents today for ear flushing secondary to having impaction a week ago.  During at his annual appointment.  He reports he is the Debrox but he started having stabbing-like pains in his ear.   Today patient denies signs and symptoms of COVID 19 infection including fever, chills, cough, shortness of breath, and headache. Past Medical, Surgical, Social History, Allergies, and Medications have been Reviewed.   Past Medical History:  Diagnosis Date  . Epilepsy (HCC)   . Renal disorder    Kidney Stone 2008  . RUQ pain 08/06/2019  . Seizures (HCC)     Current Meds  Medication Sig  . acetaminophen (TYLENOL) 500 MG tablet Take 1,000 mg by mouth every 6 (six) hours as needed for moderate pain.  . carbamide peroxide (DEBROX) 6.5 % OTIC solution Place 5 drops into both ears 2 (two) times daily. For 7 days  . ibuprofen (ADVIL) 800 MG tablet Take 1 tablet (800 mg total) by mouth 3 (three) times daily.    ROS:  Review of Systems  Constitutional: Negative.   HENT: Positive for ear pain.   Eyes: Negative.   Respiratory: Negative.   Cardiovascular: Negative.   Gastrointestinal: Negative.   Genitourinary: Negative.   Musculoskeletal: Negative.   Skin: Negative.   Neurological: Negative.   Endo/Heme/Allergies: Negative.   Psychiatric/Behavioral: Negative.   All other systems reviewed and are negative.    Objective:   Today's Vitals: BP 118/78 (BP Location: Right Arm, Patient Position: Sitting, Cuff Size: Normal)   Pulse 75   Temp (!) 97.2 F (36.2 C) (Temporal)   Resp 16   Ht 5\' 7"  (1.702 m)   Wt (!) 242 lb  (109.8 kg)   SpO2 97%   BMI 37.90 kg/m  Vitals with BMI 02/12/2020 02/04/2020 01/25/2020  Height 5\' 7"  5\' 6"  5\' 7"   Weight 242 lbs 242 lbs 250 lbs  BMI 37.89 39.08 39.15  Systolic 118 118 03/27/2020  Diastolic 78 79 105  Pulse 75 84 89  Some encounter information is confidential and restricted. Go to Review Flowsheets activity to see all data.     Physical Exam Vitals and nursing note reviewed.  Constitutional:      Appearance: Normal appearance. He is well-developed and well-groomed. He is obese.  HENT:     Head: Normocephalic and atraumatic.     Right Ear: Ear canal and external ear normal. There is impacted cerumen.     Left Ear: Ear canal and external ear normal. There is impacted cerumen.     Ears:     Comments: Left ear is a dark brown to black impaction  Right ear is loosened cerumen impaction  Inability to visualize TM bilaterally Eyes:     General:        Right eye: No discharge.        Left eye: No discharge.     Conjunctiva/sclera: Conjunctivae normal.  Cardiovascular:     Rate and Rhythm: Normal rate and regular rhythm.  Pulses: Normal pulses.     Heart sounds: Normal heart sounds.  Pulmonary:     Effort: Pulmonary effort is normal.     Breath sounds: Normal breath sounds.  Musculoskeletal:        General: Normal range of motion.     Cervical back: Normal range of motion and neck supple.  Skin:    General: Skin is warm.  Neurological:     General: No focal deficit present.     Mental Status: He is alert and oriented to person, place, and time.  Psychiatric:        Attention and Perception: Attention normal.        Mood and Affect: Mood normal.        Speech: Speech normal.        Behavior: Behavior normal. Behavior is cooperative.        Thought Content: Thought content normal.        Cognition and Memory: Cognition normal.        Judgment: Judgment normal.    Ear irrigation performed after consent provided on right side.  Patient declined right side.   Tolerated but with some discomfort.   Assessment   1. Bilateral impacted cerumen   2. Maxilla pain     Tests ordered Orders Placed This Encounter  Procedures  . Ambulatory referral to ENT     Plan: Please see assessment and plan per problem list above.   No orders of the defined types were placed in this encounter.   Patient to follow-up in Visit date not found  Freddy Finner, NP

## 2020-02-12 NOTE — Patient Instructions (Addendum)
I appreciate the opportunity to provide you with care for your health and wellness. Today we discussed: ear pain  Follow up: 1 year for annual visit -fasting in the morning  No labs   Referrals today- urgent to ENT for ear and new cheek bone pain  STOP DEBROX, AVOID SWIMMING  Please continue to practice social distancing to keep you, your family, and our community safe.  If you must go out, please wear a mask and practice good handwashing.  It was a pleasure to see you and I look forward to continuing to work together on your health and well-being. Please do not hesitate to call the office if you need care or have questions about your care.  Have a wonderful day and week. With Gratitude, Tereasa Coop, DNP, AGNP-BC

## 2020-07-12 ENCOUNTER — Emergency Department (HOSPITAL_COMMUNITY)
Admission: EM | Admit: 2020-07-12 | Discharge: 2020-07-12 | Disposition: A | Discharge status: Home / Self Care | Payer: Medicaid Other | Attending: Emergency Medicine | Admitting: Emergency Medicine

## 2020-07-12 ENCOUNTER — Encounter (HOSPITAL_COMMUNITY): Payer: Self-pay

## 2020-07-12 ENCOUNTER — Other Ambulatory Visit: Payer: Self-pay

## 2020-07-12 DIAGNOSIS — H9201 Otalgia, right ear: Secondary | ICD-10-CM | POA: Diagnosis present

## 2020-07-12 DIAGNOSIS — H6123 Impacted cerumen, bilateral: Secondary | ICD-10-CM | POA: Diagnosis not present

## 2020-07-12 DIAGNOSIS — H6121 Impacted cerumen, right ear: Secondary | ICD-10-CM | POA: Diagnosis not present

## 2020-07-12 MED ORDER — DOCUSATE SODIUM 50 MG/5ML PO LIQD
50.0000 mg | Freq: Once | ORAL | Status: DC
  Filled 2020-07-12: qty 10

## 2020-07-12 NOTE — Discharge Instructions (Addendum)
Call Dr Avel Sensor office, Ears, Nose and Throat specialist, to have him evaluate your ear.

## 2020-07-12 NOTE — ED Triage Notes (Signed)
Pt reports right ear pain that started 3 days ago. Pt said it got worse today. Pt denies fever or any other symptoms.

## 2020-07-12 NOTE — ED Provider Notes (Signed)
[facility] EMERGENCY DEPARTMENT Provider Note   CSN: [id number] Arrival date & time: 07/12/20  0106   Time seen 3:12 AM  History Chief Complaint  Patient presents with   Otalgia    Right ear    Carl Ball is a 34 y.o. male.  HPI   Patient states he has had problems of his ears for a long time.  He states he gets frequent wax build up.  He states he was seen a year ago by his PCP and when they put medicine in his ear he could tasted in his throat.  He was supposed to be referred to ENT but that has not happened.  He states he has had right ear pain for a few days and today he is having trouble hearing from his right ear.  He thinks his ear is blocked and he thinks maybe it is draining because sometimes clear liquid comes out but he also had that happen after taking a shower.  He has been using a Q-tip all day long trying to relieve the feeling that he cannot hear.  He states he takes 1 shower day.  He denies fever, rhinorrhea, sore throat, or cough.  PCP Freddy Finner, NP   Past Medical History:  Diagnosis Date   Epilepsy [facility])    Renal disorder    Kidney Stone 2008   RUQ pain 08/06/2019   Seizures [facility])     Patient Active Problem List   Diagnosis Date Noted   Annual visit for general adult medical examination with abnormal findings 02/04/2020   Calculus of gallbladder without cholecystitis without obstruction 02/04/2020   Bilateral impacted cerumen 02/04/2020   Seizure disorder (HCC) 08/06/2019   Obesity (BMI 35.0-39.9 without comorbidity) 08/06/2019    History reviewed. No pertinent surgical history.     Family History  Problem Relation Age of Onset   Heart failure Mother    Osteoarthritis Mother    Hypertension Mother    Diabetes Father    Osteoarthritis Father    Hypertension Father     Social History   Tobacco Use   Smoking status: Never Smoker   Smokeless tobacco: Never Used  Vaping Use   Vaping Use: Never used  Substance  Use Topics   Alcohol use: Yes   Drug use: No    Home Medications Prior to Admission medications   Medication Sig Start Date End Date Taking? Authorizing Provider  acetaminophen (TYLENOL) 500 MG tablet Take 1,000 mg by mouth every 6 (six) hours as needed for moderate pain.    [provider]  carbamide peroxide (DEBROX) 6.5 % OTIC solution Place 5 drops into both ears 2 (two) times daily. For 7 days 02/04/20   Freddy Finner, NP  ibuprofen (ADVIL) 800 MG tablet Take 1 tablet (800 mg total) by mouth 3 (three) times daily. 01/25/20   Burgess Amor, PA-C    Allergies    Aspirin  Review of Systems   Review of Systems  All other systems reviewed and are negative.   Physical Exam Updated Vital Signs BP 123/85 (BP Location: Right Arm)    Pulse 80    Temp 98.3 F (36.8 C) (Oral)    Resp 18    Ht 5\' 7"  (1.702 m)    Wt 108.9 kg    SpO2 100%    BMI 37.59 kg/m   Physical Exam Vitals and nursing note reviewed.  Constitutional:      General: He is not in acute distress.  Appearance: Normal appearance. He is normal weight.  HENT:     Head: Normocephalic and atraumatic.     Right Ear: There is impacted cerumen.     Left Ear: There is impacted cerumen.     Ears:     Comments: The earwax on the right is deep in the ear canal most likely from  patient using a Q-tip    Nose: Nose normal.  Eyes:     Extraocular Movements: Extraocular movements intact.     Conjunctiva/sclera: Conjunctivae normal.     Pupils: Pupils are equal, round, and reactive to light.  Cardiovascular:     Rate and Rhythm: Normal rate.  Pulmonary:     Effort: Pulmonary effort is normal. No respiratory distress.  Skin:    General: Skin is warm.  Neurological:     General: No focal deficit present.     Mental Status: He is alert and oriented to person, place, and time.  Psychiatric:        Mood and Affect: Mood normal.        Behavior: Behavior normal.        Thought Content: Thought content normal.      ED Results / Procedures / Treatments   Labs (all labs ordered are listed, but only abnormal results are displayed) Labs Reviewed - No data to display  EKG None  Radiology No results found.  Procedures Procedures (including critical care time)  Medications Ordered in ED Medications  docusate (COLACE) 50 MG/5ML liquid 50 mg (50 mg Right EAR Patient Refused/Not Given 07/12/20 0336)    ED Course  I have reviewed the triage vital signs and the nursing notes.  Pertinent labs & imaging results that were available during my care of the patient were reviewed by me and considered in my medical decision making (see chart for details).    MDM Rules/Calculators/A&P                          Colace was ordered to soften the earwax and then I was go have the nurse try to irrigate his ear however patient refused.  He states he prefers to follow-up with ENT on Monday, tomorrow.    Final Clinical Impression(s) / ED Diagnoses Final diagnoses:  Impacted cerumen of right ear    Rx / DC Orders ED Discharge Orders    None     Plan discharge  Devoria Albe, MD, Concha Pyo, MD 07/12/20 [phone]

## 2020-07-12 NOTE — ED Notes (Signed)
Pt refusing med at this time. Pt stated that every time his ear is flushed, the water goes into his mouth and makes him nauseous. Pt stated that his PCP thinks he has a hole in his eardrum and that is what causes that to happen. D/t this, pt stated that he would prefer to not do the ear flush now and to just call the ENT doctor on Monday to set up an appointment. Pt is afraid that the ear flush will worsen his pain.  Dr. Lynelle Doctor made aware.

## 2020-07-13 ENCOUNTER — Encounter: Payer: Self-pay | Admitting: Nurse Practitioner

## 2020-07-13 ENCOUNTER — Other Ambulatory Visit: Payer: Self-pay

## 2020-07-13 ENCOUNTER — Ambulatory Visit: Payer: Medicaid Other | Admitting: Nurse Practitioner

## 2020-07-13 VITALS — BP 128/81 | HR 80 | Temp 98.3°F | Resp 16 | Ht 67.0 in | Wt 233.0 lb

## 2020-07-13 DIAGNOSIS — H6123 Impacted cerumen, bilateral: Secondary | ICD-10-CM | POA: Diagnosis not present

## 2020-07-13 NOTE — Progress Notes (Addendum)
Acute Office Visit  Subjective:    Patient ID: BROADY Ball, male    DOB: Nov 24, 1985, 34 y.o.   MRN: [id number]  Chief Complaint  Patient presents with  . Otalgia    R ear draining, pain x 3 days    HPI Patient is in today for right ear pain x3 days.  Denies illness or trauma. He has clear drainage with a foul odor.  He states he can't hear out of his right ear.   Past Medical History:  Diagnosis Date  . Epilepsy (HCC)   . Renal disorder    Kidney Stone 2008  . RUQ pain 08/06/2019  . Seizures (HCC)     History reviewed. No pertinent surgical history.  Family History  Problem Relation Age of Onset  . Heart failure Mother   . Osteoarthritis Mother   . Hypertension Mother   . Diabetes Father   . Osteoarthritis Father   . Hypertension Father     Social History   Socioeconomic History  . Marital status: Significant Other    Spouse name: Not on file  . Number of children: 2  . Years of education: Not on file  . Highest education level: 11th grade  Occupational History  . Not on file  Tobacco Use  . Smoking status: Never Smoker  . Smokeless tobacco: Never Used  Vaping Use  . Vaping Use: Never used  Substance and Sexual Activity  . Alcohol use: Yes  . Drug use: No  . Sexual activity: Yes    Birth control/protection: Pill  Other Topics Concern  . Not on file  Social History Narrative   Lives with significant other Olivia    2 biological   5- Rachel   3- Primo    1 step child- Sophia who is 6      Enjoys: loves being outside, enjoys building       Diet: eats all food groups outside, enjoys salty foods, avoids candy and sugar   Caffeine:  Enjoys starbucks daily, sweet tea, cheerwine    Water: 2-3 20 oz daily       Wears seat belt    Smoke detectors and carbon monoxide at home    Does not use phone while driving    Social Determinants of Health   Financial Resource Strain: Low Risk   . Difficulty of Paying Living Expenses: Not hard at all  Food  Insecurity: No Food Insecurity  . Worried About Programme researcher, broadcasting/film/video in the Last Year: Never true  . Ran Out of Food in the Last Year: Never true  Transportation Needs: No Transportation Needs  . Lack of Transportation (Medical): No  . Lack of Transportation (Non-Medical): No  Physical Activity: Inactive  . Days of Exercise per Week: 0 days  . Minutes of Exercise per Session: 0 min  Stress: No Stress Concern Present  . Feeling of Stress : Not at all  Social Connections: Moderately Integrated  . Frequency of Communication with Friends and Family: More than three times a week  . Frequency of Social Gatherings with Friends and Family: More than three times a week  . Attends Religious Services: 1 to 4 times per year  . Active Member of Clubs or Organizations: No  . Attends Banker Meetings: Never  . Marital Status: Living with partner  Intimate Partner Violence: Not At Risk  . Fear of Current or Ex-Partner: No  . Emotionally Abused: No  . Physically Abused: No  .  Sexually Abused: No    Outpatient Medications Prior to Visit  Medication Sig Dispense Refill  . acetaminophen (TYLENOL) 500 MG tablet Take 1,000 mg by mouth every 6 (six) hours as needed for moderate pain.    . carbamide peroxide (DEBROX) 6.5 % OTIC solution Place 5 drops into both ears 2 (two) times daily. For 7 days 15 mL 0  . ibuprofen (ADVIL) 800 MG tablet Take 1 tablet (800 mg total) by mouth 3 (three) times daily. 21 tablet 0   No facility-administered medications prior to visit.    Allergies  Allergen Reactions  . Aspirin Other (See Comments)    Causing Seizures: Epilepsy    Review of Systems  Constitutional: Negative.   HENT: Positive for ear discharge, ear pain and hearing loss.        Objective:    Physical Exam Constitutional:      Appearance: He is obese.  HENT:     Head: Normocephalic and atraumatic.     Right Ear: There is impacted cerumen.     Left Ear: External ear normal. There  is impacted cerumen.     Ears:     Comments: Mild tenderness to right tragus on palpation Neurological:     Mental Status: He is alert.     BP 128/81   Pulse 80   Temp 98.3 F (36.8 C)   Resp 16   Ht 5\' 7"  (1.702 m)   Wt 233 lb (105.7 kg)   SpO2 98%   BMI 36.49 kg/m  Wt Readings from Last 3 Encounters:  07/13/20 233 lb (105.7 kg)  07/12/20 240 lb (108.9 kg)  02/12/20 (!) 242 lb (109.8 kg)    Health Maintenance Due  Topic Date Due  . Hepatitis C Screening  Never done  . COVID-19 Vaccine (1) Never done  . INFLUENZA VACCINE  Never done    There are no preventive care reminders to display for this patient.   No results found for: TSH Lab Results  Component Value Date   WBC 7.2 02/05/2020   HGB 15.2 02/05/2020   HCT 46.5 02/05/2020   MCV 94 02/05/2020   PLT 303 02/05/2020   Lab Results  Component Value Date   NA 144 02/05/2020   K 4.4 02/05/2020   CO2 25 02/05/2020   GLUCOSE 87 02/05/2020   BUN 11 02/05/2020   CREATININE 0.92 02/05/2020   BILITOT 1.2 02/05/2020   ALKPHOS 139 (H) 02/05/2020   AST 21 02/05/2020   ALT 29 02/05/2020   PROT 6.7 02/05/2020   ALBUMIN 4.5 02/05/2020   CALCIUM 9.3 02/05/2020   ANIONGAP 6 07/28/2019   Lab Results  Component Value Date   CHOL 111 02/05/2020   Lab Results  Component Value Date   HDL 44 02/05/2020   Lab Results  Component Value Date   LDLCALC 51 02/05/2020   Lab Results  Component Value Date   TRIG 77 02/05/2020   Lab Results  Component Value Date   CHOLHDL 2.5 02/05/2020   Lab Results  Component Value Date   HGBA1C 5.2 02/05/2020       Assessment & Plan:   Problem List Items Addressed This Visit      Nervous and Auditory   Bilateral impacted cerumen - Primary    -he has mild right tragus pain with palpation and reports clear odorous discharge; no discharge visualized on exam -he does have bilateral cerumen impaction today and states he has been using Q-tips -irrigated today; after  irrigation TMs visible with no sign of infection          No orders of the defined types were placed in this encounter.    Heather Roberts, NP

## 2020-07-13 NOTE — Assessment & Plan Note (Addendum)
-  he has mild right tragus pain with palpation and reports clear odorous discharge; no discharge visualized on exam -he does have bilateral cerumen impaction today and states he has been using Q-tips -irrigated today; after irrigation TMs visible with no sign of infection

## 2020-07-28 DIAGNOSIS — H938X3 Other specified disorders of ear, bilateral: Secondary | ICD-10-CM | POA: Diagnosis not present

## 2020-07-28 DIAGNOSIS — H6122 Impacted cerumen, left ear: Secondary | ICD-10-CM | POA: Diagnosis not present

## 2020-07-28 DIAGNOSIS — L299 Pruritus, unspecified: Secondary | ICD-10-CM | POA: Diagnosis not present

## 2020-08-24 ENCOUNTER — Other Ambulatory Visit: Payer: Self-pay

## 2020-08-24 ENCOUNTER — Encounter (HOSPITAL_COMMUNITY): Payer: Self-pay | Admitting: Emergency Medicine

## 2020-08-24 ENCOUNTER — Emergency Department (HOSPITAL_COMMUNITY)
Admission: EM | Admit: 2020-08-24 | Discharge: 2020-08-24 | Disposition: A | Discharge status: Home / Self Care | Payer: Medicaid Other | Attending: Emergency Medicine | Admitting: Emergency Medicine

## 2020-08-24 DIAGNOSIS — H16133 Photokeratitis, bilateral: Secondary | ICD-10-CM | POA: Diagnosis not present

## 2020-08-24 DIAGNOSIS — H5713 Ocular pain, bilateral: Secondary | ICD-10-CM | POA: Diagnosis present

## 2020-08-24 MED ORDER — ERYTHROMYCIN 5 MG/GM OP OINT
TOPICAL_OINTMENT | Freq: Once | OPHTHALMIC | Status: AC
  Administered 2020-08-24: 1 via OPHTHALMIC
  Filled 2020-08-24: qty 3.5

## 2020-08-24 MED ORDER — TETRACAINE HCL 0.5 % OP SOLN
1.0000 [drp] | Freq: Once | OPHTHALMIC | Status: AC
  Administered 2020-08-24: 1 [drp] via OPHTHALMIC
  Filled 2020-08-24: qty 4

## 2020-08-24 MED ORDER — FLUORESCEIN SODIUM 1 MG OP STRP
1.0000 | ORAL_STRIP | Freq: Once | OPHTHALMIC | Status: AC
  Administered 2020-08-24: 1 via OPHTHALMIC
  Filled 2020-08-24: qty 1

## 2020-08-24 MED ORDER — ERYTHROMYCIN 5 MG/GM OP OINT: TOPICAL_OINTMENT | Freq: Four times a day (QID) | OPHTHALMIC | 0 refills | Status: AC

## 2020-08-24 NOTE — ED Triage Notes (Signed)
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 Now here with "burns to eyes". Sclera of both eyes are red.

## 2020-08-24 NOTE — ED Notes (Signed)
ED Provider at bedside. 

## 2020-08-28 NOTE — ED Provider Notes (Signed)
[facility] EMERGENCY DEPARTMENT Provider Note   CSN: [id number] Arrival date & time: 08/24/20  8127     History Chief Complaint  Patient presents with  . Eye Burns    Carl Ball is a 35 y.o. male.  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 Now has pain in both eyes (burining). Slightly blurry vision but comes and goes.    Eye Pain This is a new problem. The current episode started 3 to 5 hours ago. The problem occurs constantly. The problem has been gradually worsening. Pertinent negatives include no chest pain. Nothing aggravates the symptoms.       Past Medical History:  Diagnosis Date  . Epilepsy (HCC)   . Renal disorder    Kidney Stone 2008  . RUQ pain 08/06/2019  . Seizures [facility])     Patient Active Problem List   Diagnosis Date Noted  . Annual visit for general adult medical examination with abnormal findings 02/04/2020  . Calculus of gallbladder without cholecystitis without obstruction 02/04/2020  . Bilateral impacted cerumen 02/04/2020  . Seizure disorder (HCC) 08/06/2019  . Obesity (BMI 35.0-39.9 without comorbidity) 08/06/2019    History reviewed. No pertinent surgical history.     Family History  Problem Relation Age of Onset  . Heart failure Mother   . Osteoarthritis Mother   . Hypertension Mother   . Diabetes Father   . Osteoarthritis Father   . Hypertension Father     Social History   Tobacco Use  . Smoking status: Never Smoker  . Smokeless tobacco: Never Used  Vaping Use  . Vaping Use: Never used  Substance Use Topics  . Alcohol use: Yes  . Drug use: No    Home Medications Prior to Admission medications   Medication Sig Start Date End Date Taking? Authorizing Provider  erythromycin ophthalmic ointment Place into both eyes 4 (four) times daily for 5 days. Place a 1/2 inch ribbon of ointment into the lower eyelid. 08/24/20 08/29/20 Yes Janyiah Silveri, Barbara Cower, MD  acetaminophen (TYLENOL) 500 MG tablet Take 1,000 mg by mouth every 6 (six)  hours as needed for moderate pain.    [provider]  carbamide peroxide (DEBROX) 6.5 % OTIC solution Place 5 drops into both ears 2 (two) times daily. For 7 days 02/04/20   Freddy Finner, NP  ibuprofen (ADVIL) 800 MG tablet Take 1 tablet (800 mg total) by mouth 3 (three) times daily. 01/25/20   Burgess Amor, PA-C    Allergies    Aspirin  Review of Systems   Review of Systems  Eyes: Positive for pain.  Cardiovascular: Negative for chest pain.  All other systems reviewed and are negative.   Physical Exam Updated Vital Signs BP 134/89 (BP Location: Right Arm)   Pulse 99   Temp 97.8 F (36.6 C) (Oral)   Resp 20   Ht 5\' 7"  (1.702 m)   Wt 104.3 kg   SpO2 100%   BMI 36.02 kg/m   Physical Exam Vitals and nursing note reviewed.  Constitutional:      Appearance: He is well-developed and well-nourished.  HENT:     Head: Normocephalic and atraumatic.     Mouth/Throat:     Mouth: Mucous membranes are moist.  Eyes:     Pupils: Pupils are equal, round, and reactive to light.     Comments: Bilateral injected conjunctiva PERRL Vision intact No foreign bodies noted No foreign bodies on woods lamp  Cardiovascular:     Rate and Rhythm:  Normal rate.  Pulmonary:     Effort: Pulmonary effort is normal. No respiratory distress.  Abdominal:     General: There is no distension.  Musculoskeletal:        General: Normal range of motion.     Cervical back: Normal range of motion.  Skin:    General: Skin is warm and dry.  Neurological:     General: No focal deficit present.     Mental Status: He is alert.     ED Results / Procedures / Treatments   Labs (all labs ordered are listed, but only abnormal results are displayed) Labs Reviewed - No data to display  EKG None  Radiology No results found.  Procedures Procedures   Medications Ordered in ED Medications  fluorescein ophthalmic strip 1 strip (1 strip Both Eyes Given by Other 08/24/20 0340)  tetracaine  (PONTOCAINE) 0.5 % ophthalmic solution 1 drop (1 drop Both Eyes Given by Other 08/24/20 0340)  erythromycin ophthalmic ointment (1 application Both Eyes Given 08/24/20 0351)    ED Course  I have reviewed the triage vital signs and the nursing notes.  Pertinent labs & imaging results that were available during my care of the patient were reviewed by me and considered in my medical decision making (see chart for details).    MDM Rules/Calculators/A&P                          likeoly photokeratitis. Supportive care and abx prophylaxis as above.   Final Clinical Impression(s) / ED Diagnoses Final diagnoses:  Photokeratitis of both eyes    Rx / DC Orders ED Discharge Orders         Ordered    erythromycin ophthalmic ointment  4 times daily        08/24/20 0344    Ambulatory referral to Ophthalmology       Comments: As needed for ED follow up   08/24/20 0346           Sota Hetz, Barbara Cower, MD 08/28/20 0451

## 2020-11-09 DIAGNOSIS — H938X3 Other specified disorders of ear, bilateral: Secondary | ICD-10-CM | POA: Diagnosis not present

## 2020-11-09 DIAGNOSIS — H6123 Impacted cerumen, bilateral: Secondary | ICD-10-CM | POA: Insufficient documentation

## 2021-01-25 ENCOUNTER — Encounter: Payer: Self-pay | Admitting: Nurse Practitioner

## 2021-01-25 ENCOUNTER — Telehealth (INDEPENDENT_AMBULATORY_CARE_PROVIDER_SITE_OTHER): Payer: Medicaid Other | Admitting: Nurse Practitioner

## 2021-01-25 ENCOUNTER — Other Ambulatory Visit: Payer: Self-pay

## 2021-01-25 ENCOUNTER — Ambulatory Visit: Payer: Medicaid Other

## 2021-01-25 DIAGNOSIS — J069 Acute upper respiratory infection, unspecified: Secondary | ICD-10-CM | POA: Diagnosis not present

## 2021-01-25 LAB — POCT RAPID STREP A (OFFICE): Rapid Strep A Screen: NEGATIVE

## 2021-01-25 MED ORDER — NOREL AD 4-10-325 MG PO TABS: 1.0000 | ORAL_TABLET | ORAL | 1 refills | Status: DC | PRN

## 2021-01-25 MED ORDER — PHENOL 1.4 % MT LIQD
1.0000 | OROMUCOSAL | 0 refills | Status: DC | PRN
Start: 2021-01-25 — End: 2021-02-02

## 2021-01-25 NOTE — Assessment & Plan Note (Addendum)
-  will get strep and COVID testing today -she states his throat is in severe pain -Rx. norel and chloraseptic spray -if strep negative, will consider abx if he is still symptomatic on 01/28/21

## 2021-01-25 NOTE — Progress Notes (Signed)
You already called him.  No abx d/t negative strep. Will consider abx if he still has symptoms on Thursday. I sent in 2 medicines for his symptoms already.

## 2021-01-25 NOTE — Progress Notes (Signed)
Acute Office Visit  Subjective:    Patient ID: Carl Ball, male    DOB: 30-Jan-1986, 35 y.o.   MRN: [id number]  Chief Complaint  Patient presents with   Sinus Problem    Pt has ear pain headache and sore throat started 01-22-21 no fever that he is aware of     Sinus Problem Associated symptoms include congestion, ear pain, headaches, sinus pressure and a sore throat. Pertinent negatives include no chills, coughing or shortness of breath.  Patient is in today for sick visit.  He has tried alka-seltzer sinus, and that helped a little. He denies recent sick contacts.  Past Medical History:  Diagnosis Date   Epilepsy [facility])    Renal disorder    Kidney Stone 2008   RUQ pain 08/06/2019   Seizures (HCC)     History reviewed. No pertinent surgical history.  Family History  Problem Relation Age of Onset   Heart failure Mother    Osteoarthritis Mother    Hypertension Mother    Diabetes Father    Osteoarthritis Father    Hypertension Father     Social History   Socioeconomic History   Marital status: Significant Other    Spouse name: Not on file   Number of children: 2   Years of education: Not on file   Highest education level: 11th grade  Occupational History   Not on file  Tobacco Use   Smoking status: Never   Smokeless tobacco: Never  Vaping Use   Vaping Use: Never used  Substance and Sexual Activity   Alcohol use: Yes   Drug use: No   Sexual activity: Yes    Birth control/protection: Pill  Other Topics Concern   Not on file  Social History Narrative   Lives with significant other Olivia    2 biological   5- Rachel   3- [place]    1 step child- Sophia who is 6      Enjoys: loves being outside, enjoys building       Diet: eats all food groups outside, enjoys salty foods, avoids candy and sugar   Caffeine:  Enjoys starbucks daily, sweet tea, cheerwine    Water: 2-3 20 oz daily       Wears seat belt    Smoke detectors and carbon monoxide at home     Does not use phone while driving    Social Determinants of Corporate investment banker Strain: Not on file  Food Insecurity: Not on file  Transportation Needs: Not on file  Physical Activity: Not on file  Stress: Not on file  Social Connections: Not on file  Intimate Partner Violence: Not on file    Outpatient Medications Prior to Visit  Medication Sig Dispense Refill   carbamide peroxide (DEBROX) 6.5 % OTIC solution Place 5 drops into both ears 2 (two) times daily. For 7 days 15 mL 0   acetaminophen (TYLENOL) 500 MG tablet Take 1,000 mg by mouth every 6 (six) hours as needed for moderate pain. (Patient not taking: Reported on 01/25/2021)     ibuprofen (ADVIL) 800 MG tablet Take 1 tablet (800 mg total) by mouth 3 (three) times daily. (Patient not taking: Reported on 01/25/2021) 21 tablet 0   No facility-administered medications prior to visit.    Allergies  Allergen Reactions   Aspirin Other (See Comments)    Causing Seizures: Epilepsy    Review of Systems  Constitutional:  Negative for chills, fatigue and fever.  HENT:  Positive for congestion, ear pain, sinus pressure, sinus pain and sore throat. Negative for rhinorrhea.   Respiratory:  Negative for cough, shortness of breath and wheezing.   Neurological:  Positive for headaches.      Objective:    Physical Exam  There were no vitals taken for this visit. Wt Readings from Last 3 Encounters:  08/24/20 230 lb (104.3 kg)  07/13/20 233 lb (105.7 kg)  07/12/20 240 lb (108.9 kg)    Health Maintenance Due  Topic Date Due   COVID-19 Vaccine (1) Never done   HIV Screening  Never done   Hepatitis C Screening  Never done    There are no preventive care reminders to display for this patient.   No results found for: TSH Lab Results  Component Value Date   WBC 7.2 02/05/2020   HGB 15.2 02/05/2020   HCT 46.5 02/05/2020   MCV 94 02/05/2020   PLT 303 02/05/2020   Lab Results  Component Value Date   NA 144 02/05/2020    K 4.4 02/05/2020   CO2 25 02/05/2020   GLUCOSE 87 02/05/2020   BUN 11 02/05/2020   CREATININE 0.92 02/05/2020   BILITOT 1.2 02/05/2020   ALKPHOS 139 (H) 02/05/2020   AST 21 02/05/2020   ALT 29 02/05/2020   PROT 6.7 02/05/2020   ALBUMIN 4.5 02/05/2020   CALCIUM 9.3 02/05/2020   ANIONGAP 6 07/28/2019   Lab Results  Component Value Date   CHOL 111 02/05/2020   Lab Results  Component Value Date   HDL 44 02/05/2020   Lab Results  Component Value Date   LDLCALC 51 02/05/2020   Lab Results  Component Value Date   TRIG 77 02/05/2020   Lab Results  Component Value Date   CHOLHDL 2.5 02/05/2020   Lab Results  Component Value Date   HGBA1C 5.2 02/05/2020       Assessment & Plan:   Problem List Items Addressed This Visit       Respiratory   URI (upper respiratory infection) - Primary    -will get strep and COVID testing today -she states his throat is in severe pain -Rx. norel and chloraseptic spray -if strep negative, will consider abx if he is still symptomatic on 01/28/21       Relevant Medications   phenol (CHLORASEPTIC) 1.4 % LIQD   Chlorphen-PE-Acetaminophen (NOREL AD) 4-10-325 MG TABS   Other Relevant Orders   Novel Coronavirus, NAA ([facility])   POCT rapid strep A (Completed)     Meds ordered this encounter  Medications   phenol (CHLORASEPTIC) 1.4 % LIQD    Sig: Use as directed 1 spray in the mouth or throat as needed for throat irritation / pain.    Dispense:  177 mL    Refill:  0   Chlorphen-PE-Acetaminophen (NOREL AD) 4-10-325 MG TABS    Sig: Take 1 tablet by mouth every 4 (four) hours as needed (nasal congestion, cold symptoms).    Dispense:  20 tablet    Refill:  1    Date:  01/25/2021   Location of Patient: Home Location of Provider: Office Consent was obtain for visit to be over via telehealth. I verified that I am speaking with the correct person using two identifiers.  I connected with  [employer] on 01/25/21 via telephone and  verified that I am speaking with the correct person using two identifiers.   I discussed the limitations of evaluation and management by telemedicine. The  patient expressed understanding and agreed to proceed.  Time spent: 8 min   Heather Roberts, NP

## 2021-01-27 LAB — SARS-COV-2, NAA 2 DAY TAT

## 2021-01-27 LAB — NOVEL CORONAVIRUS, NAA: SARS-CoV-2, NAA: NOT DETECTED

## 2021-01-27 NOTE — Progress Notes (Signed)
Negative for strep and COVID. If he is having symptoms Friday morning, call us and I'll send in an antibiotic.

## 2021-02-02 ENCOUNTER — Encounter: Payer: Self-pay | Admitting: Nurse Practitioner

## 2021-02-02 ENCOUNTER — Other Ambulatory Visit: Payer: Self-pay

## 2021-02-02 ENCOUNTER — Ambulatory Visit (INDEPENDENT_AMBULATORY_CARE_PROVIDER_SITE_OTHER): Payer: Medicaid Other | Admitting: Nurse Practitioner

## 2021-02-02 VITALS — BP 120/75 | HR 80 | Temp 97.5°F | Ht 66.0 in | Wt 213.0 lb

## 2021-02-02 DIAGNOSIS — H9203 Otalgia, bilateral: Secondary | ICD-10-CM | POA: Insufficient documentation

## 2021-02-02 DIAGNOSIS — Z0001 Encounter for general adult medical examination with abnormal findings: Secondary | ICD-10-CM

## 2021-02-02 DIAGNOSIS — L237 Allergic contact dermatitis due to plants, except food: Secondary | ICD-10-CM

## 2021-02-02 DIAGNOSIS — Z139 Encounter for screening, unspecified: Secondary | ICD-10-CM

## 2021-02-02 HISTORY — DX: Allergic contact dermatitis due to plants, except food: L23.7

## 2021-02-02 MED ORDER — ACETAMINOPHEN ER 650 MG PO TBCR
650.0000 mg | EXTENDED_RELEASE_TABLET | Freq: Three times a day (TID) | ORAL | 0 refills | Status: DC | PRN
Start: 2021-02-02 — End: 2022-04-06

## 2021-02-02 MED ORDER — TRIAMCINOLONE ACETONIDE 0.1 % EX CREA: 1.0000 "application " | TOPICAL_CREAM | Freq: Two times a day (BID) | CUTANEOUS | 0 refills | Status: DC
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
## 2021-02-02 NOTE — Assessment & Plan Note (Signed)
-  healing well -Rx. Triamcinolone cream to help with itching

## 2021-02-02 NOTE — Patient Instructions (Signed)
Please have fasting labs drawn this week.  If your ear pain doesn't improve in a week with tylenol, return to clinic or go to your ear, nose, and throat specialist.

## 2021-02-02 NOTE — Assessment & Plan Note (Addendum)
-  unsure of etiology -Rx. tylenol -RTC if this gets worse or doesn't resolve in 1 week

## 2021-02-04 ENCOUNTER — Encounter: Payer: Medicaid Other | Admitting: Nurse Practitioner

## 2021-02-15 DIAGNOSIS — M2669 Other specified disorders of temporomandibular joint: Secondary | ICD-10-CM | POA: Diagnosis not present

## 2021-02-15 DIAGNOSIS — H9202 Otalgia, left ear: Secondary | ICD-10-CM | POA: Diagnosis not present

## 2021-02-15 DIAGNOSIS — H6123 Impacted cerumen, bilateral: Secondary | ICD-10-CM | POA: Diagnosis not present

## 2021-02-23 ENCOUNTER — Other Ambulatory Visit: Payer: Self-pay | Admitting: Nurse Practitioner

## 2021-02-23 DIAGNOSIS — L237 Allergic contact dermatitis due to plants, except food: Secondary | ICD-10-CM

## 2021-03-07 ENCOUNTER — Emergency Department (HOSPITAL_COMMUNITY): Payer: Medicaid Other

## 2021-03-07 ENCOUNTER — Encounter (HOSPITAL_COMMUNITY): Payer: Self-pay | Admitting: Radiology

## 2021-03-07 ENCOUNTER — Emergency Department (HOSPITAL_COMMUNITY)
Admission: EM | Admit: 2021-03-07 | Discharge: 2021-03-07 | Disposition: A | Discharge status: Home / Self Care | Payer: Medicaid Other | Attending: Student | Admitting: Student

## 2021-03-07 ENCOUNTER — Other Ambulatory Visit: Payer: Self-pay

## 2021-03-07 DIAGNOSIS — N132 Hydronephrosis with renal and ureteral calculous obstruction: Secondary | ICD-10-CM | POA: Insufficient documentation

## 2021-03-07 DIAGNOSIS — Q7649 Other congenital malformations of spine, not associated with scoliosis: Secondary | ICD-10-CM | POA: Diagnosis not present

## 2021-03-07 DIAGNOSIS — M545 Low back pain, unspecified: Secondary | ICD-10-CM | POA: Diagnosis not present

## 2021-03-07 DIAGNOSIS — N201 Calculus of ureter: Secondary | ICD-10-CM

## 2021-03-07 LAB — COMPREHENSIVE METABOLIC PANEL
ALT: 19 U/L (ref 0–44)
AST: 18 U/L (ref 15–41)
Albumin: 4.4 g/dL (ref 3.5–5.0)
Alkaline Phosphatase: 91 U/L (ref 38–126)
Anion gap: 7 (ref 5–15)
BUN: 12 mg/dL (ref 6–20)
CO2: 27 mmol/L (ref 22–32)
Calcium: 9.2 mg/dL (ref 8.9–10.3)
Chloride: 106 mmol/L (ref 98–111)
Creatinine, Ser: 1.2 mg/dL (ref 0.61–1.24)
GFR, Estimated: >60 mL/min (ref >60)
Glucose, Bld: 99 mg/dL (ref 70–99)
Potassium: 4.5 mmol/L (ref 3.5–5.1)
Sodium: 140 mmol/L (ref 135–145)
Total Bilirubin: 2 mg/dL — ABNORMAL HIGH (ref 0.3–1.2)
Total Protein: 6.7 g/dL (ref 6.5–8.1)

## 2021-03-07 LAB — CBC WITH DIFFERENTIAL/PLATELET
Abs Immature Granulocytes: 0.03 10*3/uL (ref 0.00–0.07)
Basophils Absolute: 0 10*3/uL (ref 0.0–0.1)
Basophils Relative: 0 %
Eosinophils Absolute: 0.2 10*3/uL (ref 0.0–0.5)
Eosinophils Relative: 2 %
HCT: 45 % (ref 39.0–52.0)
Hemoglobin: 15.1 g/dL (ref 13.0–17.0)
Immature Granulocytes: 0 %
Lymphocytes Relative: 12 %
Lymphs Abs: 1.1 10*3/uL (ref 0.7–4.0)
MCH: 32.1 pg (ref 26.0–34.0)
MCHC: 33.6 g/dL (ref 30.0–36.0)
MCV: 95.7 fL (ref 80.0–100.0)
Monocytes Absolute: 0.7 10*3/uL (ref 0.1–1.0)
Monocytes Relative: 8 %
Neutro Abs: 6.8 10*3/uL (ref 1.7–7.7)
Neutrophils Relative %: 78 %
Platelets: 266 10*3/uL (ref 150–400)
RBC: 4.7 MIL/uL (ref 4.22–5.81)
RDW: 12 % (ref 11.5–15.5)
WBC: 8.8 10*3/uL (ref 4.0–10.5)
nRBC: 0 % (ref 0.0–0.2)

## 2021-03-07 LAB — URINALYSIS, ROUTINE W REFLEX MICROSCOPIC
Bacteria, UA: NONE SEEN
Bilirubin Urine: NEGATIVE
Glucose, UA: NEGATIVE mg/dL
Ketones, ur: NEGATIVE mg/dL
Leukocytes,Ua: NEGATIVE
Nitrite: NEGATIVE
Protein, ur: NEGATIVE mg/dL
Specific Gravity, Urine: 1.016 (ref 1.005–1.030)
pH: 8 (ref 5.0–8.0)

## 2021-03-07 MED ORDER — TAMSULOSIN HCL 0.4 MG PO CAPS: 0.4000 mg | ORAL_CAPSULE | Freq: Every day | ORAL | 0 refills | Status: AC

## 2021-03-07 MED ORDER — IOHEXOL 350 MG/ML SOLN
85.0000 mL | Freq: Once | INTRAVENOUS | Status: AC | PRN
  Administered 2021-03-07: 85 mL via INTRAVENOUS

## 2021-03-07 MED ORDER — KETOROLAC TROMETHAMINE 30 MG/ML IJ SOLN
15.0000 mg | Freq: Once | INTRAMUSCULAR | Status: AC
  Administered 2021-03-07: 15 mg via INTRAVENOUS
  Filled 2021-03-07: qty 1

## 2021-03-07 MED ORDER — OXYCODONE-ACETAMINOPHEN 5-325 MG PO TABS: 1.0000 | ORAL_TABLET | Freq: Three times a day (TID) | ORAL | 0 refills | Status: AC | PRN

## 2021-03-07 MED ORDER — OXYCODONE-ACETAMINOPHEN 5-325 MG PO TABS
1.0000 | ORAL_TABLET | Freq: Once | ORAL | Status: AC
Start: 2021-03-07 — End: 2021-03-07
  Administered 2021-03-07: 1 via ORAL
  Filled 2021-03-07: qty 1

## 2021-03-07 MED ORDER — ONDANSETRON 4 MG PO TBDP
4.0000 mg | ORAL_TABLET | Freq: Once | ORAL | Status: AC
  Administered 2021-03-07: 4 mg via ORAL
  Filled 2021-03-07: qty 1

## 2021-03-07 MED ORDER — KETOROLAC TROMETHAMINE 30 MG/ML IJ SOLN: 30.0000 mg | Freq: Once | INTRAMUSCULAR | Status: DC

## 2021-03-07 MED ORDER — FENTANYL CITRATE PF 50 MCG/ML IJ SOSY
75.0000 ug | PREFILLED_SYRINGE | Freq: Once | INTRAMUSCULAR | Status: AC
  Administered 2021-03-07: 75 ug via INTRAMUSCULAR
  Filled 2021-03-07: qty 2

## 2021-03-07 NOTE — ED Triage Notes (Signed)
Pt to the ED with complaints of lower back pain and flank pain that began at 0500 this morning.  Pt states when he urinates he is in a lot of pain. Denies other urinary symptoms.

## 2021-03-07 NOTE — ED Provider Notes (Signed)
Seizure [facility] EMERGENCY DEPARTMENT Provider Note   CSN: [id number] Arrival date & time: 03/07/21  1327     History Chief Complaint  Patient presents with   Flank Pain    TADD HOLTMEYER is a 35 y.o. male.  HPI  Patient with significant medical history of epilepsy, renal disorder, kidney stone, presents  with chief complaint of right lower back pain.  Patient states that started this morning around 5 AM, states the pain is constant, he feels it radiating into his right lower groin, he denies associated nausea, vomiting, diarrhea, denies stomach pain, he does note that he has increased urination, urination increases his pain, he denies hematuria, dysuria, denies testicular pain, penile discharge.  Patient notes that he has had kidney stones in the past and states this feels similar to this.  He has no associated fevers, chills, chest pain, shortness of breath, has no significant abdominal history, denies pedal edema.  Past Medical History:  Diagnosis Date   Epilepsy [facility])    Renal disorder    Kidney Stone 2008   RUQ pain 08/06/2019   Seizures [facility])     Patient Active Problem List   Diagnosis Date Noted   Pain behind the ear, bilateral 02/02/2021   Plant allergic contact dermatitis 02/02/2021   Annual visit for general adult medical examination with abnormal findings 02/04/2020   Calculus of gallbladder without cholecystitis without obstruction 02/04/2020   Seizure disorder (HCC) 08/06/2019   Obesity (BMI 35.0-39.9 without comorbidity) 08/06/2019    No past surgical history on file.     Family History  Problem Relation Age of Onset   Heart failure Mother    Osteoarthritis Mother    Hypertension Mother    Diabetes Father    Osteoarthritis Father    Hypertension Father     Social History   Tobacco Use   Smoking status: Never   Smokeless tobacco: Never  Vaping Use   Vaping Use: Never used  Substance Use Topics   Alcohol use: Yes   Drug use: No    Home  Medications Prior to Admission medications   Medication Sig Start Date End Date Taking? Authorizing Provider  oxyCODONE-acetaminophen (PERCOCET/ROXICET) 5-325 MG tablet Take 1 tablet by mouth every 8 (eight) hours as needed for up to 2 days for severe pain. 03/07/21 03/09/21 Yes Carroll Sage, PA-C  oxyCODONE-acetaminophen (PERCOCET/ROXICET) 5-325 MG tablet Take 1 tablet by mouth every 8 (eight) hours as needed for up to 2 days for severe pain. 03/07/21 03/09/21 Yes Carroll Sage, PA-C  tamsulosin (FLOMAX) 0.4 MG CAPS capsule Take 1 capsule (0.4 mg total) by mouth daily for 7 days. 03/07/21 03/14/21 Yes Carroll Sage, PA-C  acetaminophen (TYLENOL 8 HOUR) 650 MG CR tablet Take 1 tablet (650 mg total) by mouth every 8 (eight) hours as needed for pain. 02/02/21   Heather Roberts, NP  triamcinolone cream (KENALOG) 0.1 % APPLY 1 APPLICATION TOPICALLY TWICE DAILY 02/24/21   Heather Roberts, NP    Allergies    Aspirin  Review of Systems   Review of Systems  Constitutional:  Negative for chills and fever.  HENT:  Negative for congestion.   Respiratory:  Negative for shortness of breath.   Cardiovascular:  Negative for chest pain.  Gastrointestinal:  Negative for abdominal pain, nausea and vomiting.  Genitourinary:  Positive for flank pain and frequency. Negative for enuresis and penile discharge.  Musculoskeletal:  Positive for back pain.  Skin:  Negative for rash.  Neurological:  Negative for headaches.  Hematological:  Does not bruise/bleed easily.   Physical Exam Updated Vital Signs BP (!) 128/93 (BP Location: Right Arm)   Pulse 86   Temp 98 F (36.7 C) (Oral)   Resp 17   Ht 5\' 6"  (1.676 m)   Wt 99.8 kg   SpO2 100%   BMI 35.51 kg/m   Physical Exam Vitals and nursing note reviewed.  Constitutional:      General: He is not in acute distress.    Appearance: He is not ill-appearing.  HENT:     Head: Normocephalic and atraumatic.     Nose: No congestion.  Eyes:      Conjunctiva/sclera: Conjunctivae normal.  Cardiovascular:     Rate and Rhythm: Normal rate and regular rhythm.     Pulses: Normal pulses.     Heart sounds: No murmur heard.   No friction rub. No gallop.  Pulmonary:     Effort: No respiratory distress.     Breath sounds: No wheezing, rhonchi or rales.  Abdominal:     Palpations: Abdomen is soft.     Tenderness: There is abdominal tenderness. There is no right CVA tenderness or left CVA tenderness.     Comments: Abdomen nondistended normal bowel sounds, dull to percussion, slight pain in his right lower quadrant, no guarding, rebound tenderness, peritoneal sign.  Negative Murphy's or McBurney point.  Musculoskeletal:     Comments: Spine was palpated was nontender to palpation, he has no tenderness along the musculature of the right flank, patient has full range of motion lower extremities.   Skin:    General: Skin is warm and dry.  Neurological:     Mental Status: He is alert.  Psychiatric:        Mood and Affect: Mood normal.    ED Results / Procedures / Treatments   Labs (all labs ordered are listed, but only abnormal results are displayed) Labs Reviewed  URINALYSIS, ROUTINE W REFLEX MICROSCOPIC - Abnormal; Notable for the following components:      Result Value   Hgb urine dipstick MODERATE (*)    All other components within normal limits  COMPREHENSIVE METABOLIC PANEL - Abnormal; Notable for the following components:   Total Bilirubin 2.0 (*)    All other components within normal limits  CBC WITH DIFFERENTIAL/PLATELET    EKG None  Radiology CT ABDOMEN PELVIS W CONTRAST  Result Date: 03/07/2021 CLINICAL DATA:  Lower back and flank pain beginning this morning. Pain with urination. Clinical concern for appendicitis. EXAM: CT ABDOMEN AND PELVIS WITH CONTRAST TECHNIQUE: Multidetector CT imaging of the abdomen and pelvis was performed using the standard protocol following bolus administration of intravenous contrast. CONTRAST:   80mL OMNIPAQUE IOHEXOL 350 MG/ML SOLN COMPARISON:  04/11/2018. FINDINGS: Lower chest: Clear lung bases. Hepatobiliary: Normal liver. Areas of increased attenuation in the gallbladder consistent with minimally opaque stones. No gallbladder wall thickening or inflammation. No bile duct dilation. Pancreas: Unremarkable. No pancreatic ductal dilatation or surrounding inflammatory changes. Spleen: Normal in size without focal abnormality. Adrenals/Urinary Tract: No adrenal masses. Delayed enhancement of the right kidney and mild right hydronephrosis. This is due to a 2-3 mm stone in the proximal ureter. There are no additional right ureteral stones. Normal enhancement of the left kidney. No left hydronephrosis. No renal masses on either side. Single small nonobstructing stone noted in the lower pole the right kidney. Tiny nonobstructing stone noted in the midpole of the left kidney. Normal left ureter. Bladder unremarkable.  Stomach/Bowel: Stomach is within normal limits. Appendix appears normal. No evidence of bowel wall thickening, distention, or inflammatory changes. Vascular/Lymphatic: No significant vascular findings are present. No enlarged abdominal or pelvic lymph nodes. Reproductive: Unremarkable. Other: No abdominal wall hernia or abnormality. No abdominopelvic ascites. Musculoskeletal: No fracture or bone lesion. Transitional lumbosacral vertebra. Otherwise unremarkable. IMPRESSION: 1. 2-3 mm stone in the proximal right ureter causes mild right hydronephrosis. 2. No other acute abnormality within the abdomen or pelvis. Normal appendix visualized. 3. Tiny nonobstructing stones in each kidney. 4. Probable gallstones. Electronically Signed   By: Amie Portlandavid  Ormond M.D.   On: 03/07/2021 16:48    Procedures Procedures   Medications Ordered in ED Medications  fentaNYL (SUBLIMAZE) injection 75 mcg (75 mcg Intramuscular Given 03/07/21 1457)  ondansetron (ZOFRAN-ODT) disintegrating tablet 4 mg (4 mg Oral Given 03/07/21  1457)  iohexol (OMNIPAQUE) 350 MG/ML injection 85 mL (85 mLs Intravenous Contrast Given 03/07/21 1629)  oxyCODONE-acetaminophen (PERCOCET/ROXICET) 5-325 MG per tablet 1 tablet (1 tablet Oral Given 03/07/21 1721)  ketorolac (TORADOL) 30 MG/ML injection 15 mg (15 mg Intravenous Given 03/07/21 1720)    ED Course  I have reviewed the triage vital signs and the nursing notes.  Pertinent labs & imaging results that were available during my care of the patient were reviewed by me and considered in my medical decision making (see chart for details).    MDM Rules/Calculators/A&P                          Initial impression-patient presents with chief complaint of right-sided flank pain.  He is alert, does not appear acute stress, vital signs reassuring.  Concern for possible kidney stone, will obtain basic lab work-up, UA, provide patient with pain medication and reassess.  Work-up-CBC unremarkable, CMP shows slight increase in T bili of 2, UA shows red blood cells no other abnormalities present.  CT abdomen pelvis reveals a stone in the right proximal ureter measuring 2 to 3 mm causing mild hydronephrosis no other Normalities present.  Reassessment-patient was reassessed after pain medications, states he is feeling better, has no complaints this time.  Vital signs remained stable.  Updated patient on imaging, he is agreeable with plan, and is ready for discharge.  Rule out-I have low suspicion for systemic infection as patient is nontoxic-appearing, vital signs reassuring, no leukocytosis noted on CBC.  Low suspicion for liver or gallbladder abnormality as he has no right upper quadrant tenderness, negative Murphy sign, no jaundice on exam, he does have slight elevated T bili,  only marginally elevated no other abnormalities present suspect it is transient.  Low suspicion for bowel obstruction as abdomen is nondistended normal bowel sounds, tolerating p.o still passing flatus and having bowel movements.   Low suspicion for Pilo, UTI, septic stone as urine is negative for signs of infection, CT imaging also negative for Pilo.  Plan-  Right-sided flank pain-likely due to 2 to 3 mm stone, suspect this will pass on its own due to size, will start him on Flomax, provide him with pain medications, follow-up with urology for further evaluation.  Vital signs have remained stable, no indication for hospital admission.   Patient given at home care as well strict return precautions.  Patient verbalized that they understood agreed to said plan.  Final Clinical Impression(s) / ED Diagnoses Final diagnoses:  Ureterolithiasis    Rx / DC Orders ED Discharge Orders          Ordered  tamsulosin (FLOMAX) 0.4 MG CAPS capsule  Daily        03/07/21 1742    oxyCODONE-acetaminophen (PERCOCET/ROXICET) 5-325 MG tablet  Every 8 hours PRN        03/07/21 1742    oxyCODONE-acetaminophen (PERCOCET/ROXICET) 5-325 MG tablet  Every 8 hours PRN        03/07/21 1743             Carroll Sage, PA-C 03/07/21 1747    Kommor, Wyn Forster, MD 03/08/21 1640

## 2021-03-07 NOTE — Discharge Instructions (Addendum)
Imaging reveals that you have a 2 to 3 mm stone in your right ureter.  This should pass on its own.  I start you on Flomax to help dilate the ureter to help pass your stone.  This medication can drop your blood pressure so please discontinue this medication if you start to feel dizzy especially when you go from a sitting to a standing position.  Please stay hydrated this will help pass the stone.I have given you a short course of narcotics please take as prescribed.  This medication can make you drowsy do not consume alcohol or operate heavy machinery when taking this medication.  This medication is Tylenol in it do not take Tylenol and take this medication.    Please follow-up with urology for further evaluation.  Come back to the emergency department if you develop chest pain, shortness of breath, severe abdominal pain, uncontrolled nausea, vomiting, diarrhea.

## 2021-03-07 NOTE — ED Notes (Signed)
Have attempted several times to get urine sample from pt.  Pt has drank 2 cups of water  and pt states he does not have to urinate.

## 2021-03-08 ENCOUNTER — Telehealth: Payer: Self-pay

## 2021-03-08 MED FILL — Oxycodone w/ Acetaminophen Tab 5-325 MG: ORAL | Qty: 6 | Status: AC

## 2021-03-08 NOTE — Telephone Encounter (Signed)
Transition Care Management Unsuccessful Follow-up Telephone Call  Date of discharge and from where:  03/07/2021-[facility]   Attempts:  1st Attempt  Reason for unsuccessful TCM follow-up call:  Unable to leave message

## 2021-03-09 NOTE — Telephone Encounter (Signed)
Transition Care Management Follow-up Telephone Call Date of discharge and from where: 03/07/2021 from [facility] How have you been since you were released from the hospital? Pt stated that he is feeling better and believes that he has passed the stone. Pt stated that he did not have any questions or concerns. Pt has been reminded to follow up with Urology Carl Ache, MD).  Any questions or concerns? No  Items Reviewed: Did the pt receive and understand the discharge instructions provided? Yes  Medications obtained and verified? Yes  Other? No  Any new allergies since your discharge? No  Dietary orders reviewed? No Do you have support at home? Yes   Functional Questionnaire: (I = Independent and D = Dependent) ADLs: I  Bathing/Dressing- I  Meal Prep- I  Eating- I  Maintaining continence- I  Transferring/Ambulation- I  Managing Meds- I   Follow up appointments reviewed:  PCP Hospital f/u appt confirmed? No   Specialist Hospital f/u appt confirmed? No  Pt reminded to follow up with Urology.  Are transportation arrangements needed? No  If their condition worsens, is the pt aware to call PCP or go to the Emergency Dept.? Yes Was the patient provided with contact information for the PCP's office or ED? Yes Was to pt encouraged to call back with questions or concerns? Yes

## 2021-04-27 ENCOUNTER — Other Ambulatory Visit: Payer: Self-pay

## 2021-04-27 ENCOUNTER — Encounter (HOSPITAL_COMMUNITY): Payer: Self-pay | Admitting: *Deleted

## 2021-04-27 ENCOUNTER — Emergency Department (HOSPITAL_COMMUNITY): Payer: Medicaid Other

## 2021-04-27 DIAGNOSIS — Y92812 Truck as the place of occurrence of the external cause: Secondary | ICD-10-CM | POA: Diagnosis not present

## 2021-04-27 DIAGNOSIS — M25572 Pain in left ankle and joints of left foot: Secondary | ICD-10-CM | POA: Diagnosis not present

## 2021-04-27 DIAGNOSIS — S93402A Sprain of unspecified ligament of left ankle, initial encounter: Secondary | ICD-10-CM | POA: Diagnosis not present

## 2021-04-27 DIAGNOSIS — M7989 Other specified soft tissue disorders: Secondary | ICD-10-CM | POA: Diagnosis not present

## 2021-04-27 DIAGNOSIS — X501XXA Overexertion from prolonged static or awkward postures, initial encounter: Secondary | ICD-10-CM | POA: Diagnosis not present

## 2021-04-27 DIAGNOSIS — S99912A Unspecified injury of left ankle, initial encounter: Secondary | ICD-10-CM | POA: Diagnosis present
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
## 2021-04-28 ENCOUNTER — Emergency Department (HOSPITAL_COMMUNITY)
Admission: EM | Admit: 2021-04-28 | Discharge: 2021-04-28 | Disposition: A | Discharge status: Home / Self Care | Payer: Medicaid Other | Attending: Emergency Medicine | Admitting: Emergency Medicine

## 2021-04-28 DIAGNOSIS — S93402A Sprain of unspecified ligament of left ankle, initial encounter: Secondary | ICD-10-CM

## 2021-04-28 MED ORDER — IBUPROFEN 400 MG PO TABS
400.0000 mg | ORAL_TABLET | Freq: Once | ORAL | Status: AC
  Administered 2021-04-28: 400 mg via ORAL
  Filled 2021-04-28: qty 1

## 2021-04-28 MED ORDER — ACETAMINOPHEN 325 MG PO TABS
650.0000 mg | ORAL_TABLET | Freq: Once | ORAL | Status: AC
  Administered 2021-04-28: 650 mg via ORAL
  Filled 2021-04-28: qty 2

## 2021-04-28 NOTE — Discharge Instructions (Signed)
Apply ice for 30 minutes at a time, 4 times a day.  Use crutches as needed.  Wear ankle splint orthotic as needed.  Take ibuprofen or naproxen as needed for pain.  For additional pain relief, add acetaminophen.  Please note that combining acetaminophen with either ibuprofen or naproxen gives you better pain relief than taking either medication by itself.

## 2021-04-28 NOTE — ED Provider Notes (Signed)
[facility] EMERGENCY DEPARTMENT Provider Note   CSN: [id number] Arrival date & time: 04/27/21  2122     History Chief Complaint  Patient presents with   Ankle Pain    Carl Ball is a 35 y.o. male.  The history is provided by the patient.  Ankle Pain He has history of seizure disorder and comes in having injured his left ankle.  He was stepping off of the bed of a pickup truck and his left ankle twisted.  He is complaining of pain on the lateral aspect of the ankle.  There is pain with weightbearing, but he is able to bear weight.  He denies other injury.  Pain is rated at 5/10.  He denies other injury.   Past Medical History:  Diagnosis Date   Epilepsy [facility])    Renal disorder    Kidney Stone 2008   RUQ pain 08/06/2019   Seizures [facility])     Patient Active Problem List   Diagnosis Date Noted   Pain behind the ear, bilateral 02/02/2021   Plant allergic contact dermatitis 02/02/2021   Annual visit for general adult medical examination with abnormal findings 02/04/2020   Calculus of gallbladder without cholecystitis without obstruction 02/04/2020   Seizure disorder (HCC) 08/06/2019   Obesity (BMI 35.0-39.9 without comorbidity) 08/06/2019    History reviewed. No pertinent surgical history.     Family History  Problem Relation Age of Onset   Heart failure Mother    Osteoarthritis Mother    Hypertension Mother    Diabetes Father    Osteoarthritis Father    Hypertension Father     Social History   Tobacco Use   Smoking status: Never   Smokeless tobacco: Never  Vaping Use   Vaping Use: Never used  Substance Use Topics   Alcohol use: Yes   Drug use: No    Home Medications Prior to Admission medications   Medication Sig Start Date End Date Taking? Authorizing Provider  acetaminophen (TYLENOL 8 HOUR) 650 MG CR tablet Take 1 tablet (650 mg total) by mouth every 8 (eight) hours as needed for pain. 02/02/21   Heather Roberts, NP  triamcinolone cream (KENALOG)  0.1 % APPLY 1 APPLICATION TOPICALLY TWICE DAILY 02/24/21   Heather Roberts, NP    Allergies    Aspirin  Review of Systems   Review of Systems  All other systems reviewed and are negative.  Physical Exam Updated Vital Signs BP (!) 122/99   Pulse 69   Temp 98.2 F (36.8 C)   Resp 17   SpO2 100%   Physical Exam Vitals and nursing note reviewed.  35 year old male, resting comfortably and in no acute distress. Vital signs are significant for mildly elevated blood pressure. Oxygen saturation is 100%, which is normal. Head is normocephalic and atraumatic. PERRLA, EOMI. Oropharynx is clear. Neck is nontender and supple without adenopathy. Back is nontender and there is no CVA tenderness. Lungs are clear without rales, wheezes, or rhonchi. Chest is nontender. Heart has regular rate and rhythm without murmur. Abdomen is soft, flat, nontender. Extremities: There is mild swelling of the lateral aspect of the left ankle with point tenderness over the posterior fibulotalar ligament.  There is no instability of the ankle mortise and anterior drawer sign is negative.  Neurovascular exam is intact with strong dorsalis pedis pulse, prompt capillary refill, normal sensation. Skin is warm and dry without rash. Neurologic: Mental status is normal, cranial nerves are intact, there are no  motor or sensory deficits.  ED Results / Procedures / Treatments    Radiology DG Ankle Complete Left  Result Date: 04/27/2021 CLINICAL DATA:  Pain and swelling after a fall at 5 p.m. today. EXAM: LEFT ANKLE COMPLETE - 3+ VIEW COMPARISON:  None. FINDINGS: There is no evidence of fracture, dislocation, or joint effusion. There is no evidence of arthropathy or other focal bone abnormality. Soft tissues are unremarkable. IMPRESSION: Negative. Electronically Signed   By: Burman Nieves M.D.   On: 04/27/2021 21:54    Procedures .Ortho Injury Treatment  Date/Time: 04/28/2021 3:24 AM Performed by: Dione Booze,  MD Authorized by: Dione Booze, MD   Consent:    Consent obtained:  Verbal   Consent given by:  Patient   Risks discussed:  Stiffness   Alternatives discussed:  No treatmentInjury location: ankle Location details: left ankle Injury type: soft tissue Pre-procedure neurovascular assessment: neurovascularly intact Pre-procedure distal perfusion: normal Pre-procedure neurological function: normal Pre-procedure range of motion: reduced  Anesthesia: Local anesthesia used: no  Patient sedated: NoImmobilization: Ankle splint orthotic. Splint Applied by: ED Nurse Supplies used: Ankle splint orthotic. Post-procedure neurovascular assessment: post-procedure neurovascularly intact Post-procedure distal perfusion: normal Post-procedure neurological function: normal Post-procedure range of motion: unchanged     Medications Ordered in ED Medications  ibuprofen (ADVIL) tablet 400 mg (has no administration in time range)  acetaminophen (TYLENOL) tablet 650 mg (has no administration in time range)    ED Course  I have reviewed the triage vital signs and the nursing notes.  Pertinent imaging results that were available during my care of the patient were reviewed by me and considered in my medical decision making (see chart for details).   MDM Rules/Calculators/A&P                         Left ankle injury.  X-rays are negative for fracture.  He is placed in an ankle splint orthotic and given crutches to use as needed.  Advised to apply ice and keep the ankle elevated, take over-the-counter analgesics as needed for pain.  Old records are reviewed, and he has no relevant past visits.  Of note, he states allergy to aspirin which triggered seizures, but states that he has taken ibuprofen and naproxen in the past without triggering seizures.  Final Clinical Impression(s) / ED Diagnoses Final diagnoses:  Sprain of left ankle, unspecified ligament, initial encounter    Rx / DC Orders ED  Discharge Orders     None        Dione Booze, MD 04/28/21 [phone]

## 2021-04-29 ENCOUNTER — Telehealth: Payer: Self-pay

## 2021-04-29 NOTE — Telephone Encounter (Signed)
Transition Care Management Follow-up Telephone Call Date of discharge and from where: 04/28/2021 from [facility] How have you been since you were released from the hospital? Pt stated he is feeling some better. Pt stated that he is able to walk with some discomfort. Pt stated understanding of discharge instructions and did not have any questions or concerns at this time.  Any questions or concerns? No  Items Reviewed: Did the pt receive and understand the discharge instructions provided? Yes  Medications obtained and verified? Yes  Other? No  Any new allergies since your discharge? No  Dietary orders reviewed? No Do you have support at home? Yes   Functional Questionnaire: (I = Independent and D = Dependent) ADLs: I  Bathing/Dressing- I  Meal Prep- I  Eating- I  Maintaining continence- I  Transferring/Ambulation- I  Managing Meds- I  Follow up appointments reviewed:  Specialist Hospital f/u appt confirmed? No  Pt stated he will call ortho for follow up appt.  Are transportation arrangements needed? No  If their condition worsens, is the pt aware to call PCP or go to the Emergency Dept.? Yes Was the patient provided with contact information for the PCP's office or ED? Yes Was to pt encouraged to call back with questions or concerns? Yes

## 2021-06-20 ENCOUNTER — Encounter (HOSPITAL_COMMUNITY): Payer: Self-pay | Admitting: Emergency Medicine

## 2021-06-20 ENCOUNTER — Other Ambulatory Visit: Payer: Self-pay

## 2021-06-20 ENCOUNTER — Emergency Department (HOSPITAL_COMMUNITY)
Admission: EM | Admit: 2021-06-20 | Discharge: 2021-06-20 | Disposition: A | Discharge status: Home / Self Care | Payer: Medicaid Other | Attending: Emergency Medicine | Admitting: Emergency Medicine

## 2021-06-20 DIAGNOSIS — R21 Rash and other nonspecific skin eruption: Secondary | ICD-10-CM | POA: Diagnosis not present

## 2021-06-20 NOTE — ED Notes (Signed)
ED Provider at bedside. 

## 2021-06-20 NOTE — ED Triage Notes (Signed)
Pt states he has had sore knees and put some joint relief liquid on it and his knees started to get red.

## 2021-06-20 NOTE — ED Provider Notes (Signed)
[facility] EMERGENCY DEPARTMENT Provider Note   CSN: [id number] Arrival date & time: 06/20/21  0547     History Chief Complaint  Patient presents with   Allergic Reaction    Carl Ball is a 35 y.o. male.  The history is provided by the patient.  Allergic Reaction Presenting symptoms: rash   Severity:  Mild Duration:  2 hours Prior allergic episodes:  No prior episodes Context comment:  Topical medications Relieved by:  Nothing Worsened by:  Nothing Patient reports he gets sore knees frequently after working outside.  He reports he placed capsaicin cream on both knees A few hours later he woke up with burning and pain in both knees where he put the cream.  He reports that is now improving.  No other signs of allergic reaction.  No shortness of breath.  No facial swelling.    Past Medical History:  Diagnosis Date   Epilepsy [facility])    Renal disorder    Kidney Stone 2008   RUQ pain 08/06/2019   Seizures [facility])     Patient Active Problem List   Diagnosis Date Noted   Pain behind the ear, bilateral 02/02/2021   Plant allergic contact dermatitis 02/02/2021   Annual visit for general adult medical examination with abnormal findings 02/04/2020   Calculus of gallbladder without cholecystitis without obstruction 02/04/2020   Seizure disorder (HCC) 08/06/2019   Obesity (BMI 35.0-39.9 without comorbidity) 08/06/2019    History reviewed. No pertinent surgical history.     Family History  Problem Relation Age of Onset   Heart failure Mother    Osteoarthritis Mother    Hypertension Mother    Diabetes Father    Osteoarthritis Father    Hypertension Father     Social History   Tobacco Use   Smoking status: Never   Smokeless tobacco: Never  Vaping Use   Vaping Use: Never used  Substance Use Topics   Alcohol use: Yes   Drug use: No    Home Medications Prior to Admission medications   Medication Sig Start Date End Date Taking? Authorizing Provider   acetaminophen (TYLENOL 8 HOUR) 650 MG CR tablet Take 1 tablet (650 mg total) by mouth every 8 (eight) hours as needed for pain. 02/02/21   Heather Roberts, NP  triamcinolone cream (KENALOG) 0.1 % APPLY 1 APPLICATION TOPICALLY TWICE DAILY 02/24/21   Heather Roberts, NP    Allergies    Aspirin  Review of Systems   Review of Systems  HENT:         Denies facial swelling  Respiratory:  Negative for shortness of breath.   Skin:  Positive for rash.   Physical Exam Updated Vital Signs BP 132/83 (BP Location: Left Arm)   Pulse 68   Temp 97.6 F (36.4 C) (Oral)   Resp 16   Ht 1.676 m (5\' 6" )   Wt 99.8 kg   SpO2 100%   BMI 35.51 kg/m   Physical Exam CONSTITUTIONAL: Well developed/well nourished HEAD: Normocephalic/atraumatic EYES: EOMI ENMT: Mucous membranes moist, no angioedema NECK: supple no meningeal signs CV: S1/S2 noted LUNGS: Lungs are clear to auscultation bilaterally, no apparent distress ABDOMEN: soft NEURO: Pt is awake/alert/appropriate, moves all extremitiesx4.  No facial droop.   EXTREMITIES: pulses normal/equal, full ROM Mild erythema noted to the knees, right greater than left SKIN: warm, color normal, mild erythema to the anterior knees.  No urticaria is noted     ED Results / Procedures / Treatments  Labs (all labs ordered are listed, but only abnormal results are displayed) Labs Reviewed - No data to display  EKG None  Radiology No results found.  Procedures Procedures   Medications Ordered in ED Medications - No data to display  ED Course  I have reviewed the triage vital signs and the nursing notes.   MDM Rules/Calculators/A&P                           Patient likely with a localized reaction to capsaicin cream.  No signs of systemic allergic reaction.  Patient will stop using this cream.  He will be discharged Final Clinical Impression(s) / ED Diagnoses Final diagnoses:  Rash    Rx / DC Orders ED Discharge Orders     None         Zadie Rhine, MD 06/20/21 [phone]

## 2021-06-21 ENCOUNTER — Telehealth: Payer: Self-pay

## 2021-06-21 NOTE — Telephone Encounter (Signed)
Transition Care Management Follow-up Telephone Call Date of discharge and from where: 06/20/2021-[facility] How have you been since you were released from the hospital? Patient stated he is doing fair.  Any questions or concerns? No  Items Reviewed: Did the pt receive and understand the discharge instructions provided? Yes  Medications obtained and verified?  No new meds sent to pharmacy.  Other? No  Any new allergies since your discharge? No  Dietary orders reviewed? No Do you have support at home? Yes   Home Care and Equipment/Supplies: Were home health services ordered? not applicable If so, what is the name of the agency? N/A  Has the agency set up a time to come to the patient's home? not applicable Were any new equipment or medical supplies ordered?  No What is the name of the medical supply agency? N/A Were you able to get the supplies/equipment? not applicable Do you have any questions related to the use of the equipment or supplies? No  Functional Questionnaire: (I = Independent and D = Dependent) ADLs: I  Bathing/Dressing- I  Meal Prep- I  Eating- I  Maintaining continence- I  Transferring/Ambulation- I  Managing Meds- I  Follow up appointments reviewed:  PCP Hospital f/u appt confirmed? No   Specialist Hospital f/u appt confirmed? No   Are transportation arrangements needed? No  If their condition worsens, is the pt aware to call PCP or go to the Emergency Dept.? Yes Was the patient provided with contact information for the PCP's office or ED? Yes Was to pt encouraged to call back with questions or concerns? Yes

## 2021-09-06 DIAGNOSIS — H6123 Impacted cerumen, bilateral: Secondary | ICD-10-CM | POA: Diagnosis not present

## 2021-11-06 ENCOUNTER — Emergency Department (HOSPITAL_COMMUNITY)
Admission: EM | Admit: 2021-11-06 | Discharge: 2021-11-07 | Disposition: A | Discharge status: Home / Self Care | Payer: Medicaid Other | Attending: Emergency Medicine | Admitting: Emergency Medicine

## 2021-11-06 ENCOUNTER — Encounter (HOSPITAL_COMMUNITY): Payer: Self-pay

## 2021-11-06 ENCOUNTER — Other Ambulatory Visit: Payer: Self-pay

## 2021-11-06 DIAGNOSIS — S68111A Complete traumatic metacarpophalangeal amputation of left index finger, initial encounter: Secondary | ICD-10-CM | POA: Diagnosis not present

## 2021-11-06 DIAGNOSIS — W268XXA Contact with other sharp object(s), not elsewhere classified, initial encounter: Secondary | ICD-10-CM | POA: Insufficient documentation

## 2021-11-06 DIAGNOSIS — Z23 Encounter for immunization: Secondary | ICD-10-CM | POA: Diagnosis not present

## 2021-11-06 DIAGNOSIS — S6992XA Unspecified injury of left wrist, hand and finger(s), initial encounter: Secondary | ICD-10-CM | POA: Diagnosis not present

## 2021-11-06 MED ORDER — TETANUS-DIPHTH-ACELL PERTUSSIS 5-2.5-18.5 LF-MCG/0.5 IM SUSY
0.5000 mL | PREFILLED_SYRINGE | Freq: Once | INTRAMUSCULAR | Status: AC
  Administered 2021-11-07: 0.5 mL via INTRAMUSCULAR
  Filled 2021-11-06: qty 0.5

## 2021-11-06 NOTE — ED Triage Notes (Signed)
Pt presents with finger injury to the left tip of pointer finger. Cut with belt saw. Bleeding controlled. Last tetanus shot was in 2017 pt states. ?

## 2021-11-07 ENCOUNTER — Emergency Department (HOSPITAL_COMMUNITY): Payer: Medicaid Other

## 2021-11-07 DIAGNOSIS — S68111A Complete traumatic metacarpophalangeal amputation of left index finger, initial encounter: Secondary | ICD-10-CM | POA: Diagnosis not present

## 2021-11-07 MED ORDER — LIDOCAINE HCL (PF) 2 % IJ SOLN
INTRAMUSCULAR | Status: AC
  Filled 2021-11-07: qty 20

## 2021-11-07 MED ORDER — POVIDONE-IODINE 10 % EX SOLN
CUTANEOUS | Status: AC
  Filled 2021-11-07: qty 29.6

## 2021-11-07 MED ORDER — LIDOCAINE HCL (PF) 2 % IJ SOLN: 10.0000 mL | Freq: Once | INTRAMUSCULAR | Status: DC

## 2021-11-07 NOTE — Discharge Instructions (Signed)
You were seen today for an injury to the left finger.  He had an avulsion of the skin and nail of that finger.  Change dressings daily.  Apply antibiotic ointment.  Keep splint for protection. ?

## 2021-11-07 NOTE — ED Provider Notes (Signed)
?[facility] ?Provider Note ? ? ?CSN: [id number] ?Arrival date & time: 11/06/21  2156 ? ?  ? ?History ? ?Chief Complaint  ?Patient presents with  ? Finger Injury  ? ? ?Carl Ball is a 36 y.o. male. ? ?HPI ? ?  ?This is a 36 year old male who presents with an injury to the left index finger.  Patient reports that he was using a belt salt when his finger got pulled into the fan.  It cut and avulsed the fingertip including the nailbed.  Bleeding is controlled.  Last tetanus was in 2017.  He denies other injury. ?Home Medications ?Prior to Admission medications   ?Medication Sig Start Date End Date Taking? Authorizing Provider  ?acetaminophen (TYLENOL 8 HOUR) 650 MG CR tablet Take 1 tablet (650 mg total) by mouth every 8 (eight) hours as needed for pain. 02/02/21   Noreene Larsson, NP  ?triamcinolone cream (KENALOG) 0.1 % APPLY 1 APPLICATION TOPICALLY TWICE DAILY 02/24/21   Noreene Larsson, NP  ?   ? ?Allergies    ?Aspirin   ? ?Review of Systems   ?Review of Systems  ?Constitutional:  Negative for fever.  ?Musculoskeletal:   ?     Finger pain  ?Skin:  Positive for wound.  ?All other systems reviewed and are negative. ? ?Physical Exam ?Updated Vital Signs ?BP (!) 110/97   Pulse 70   Temp 97.7 ?F (36.5 ?C) (Oral)   Resp 14   Ht 1.676 m (5\' 6" )   Wt 95.3 kg   SpO2 100%   BMI 33.89 kg/m?  ?Physical Exam ?Vitals and nursing note reviewed.  ?Constitutional:   ?   Appearance: He is well-developed. He is not ill-appearing.  ?HENT:  ?   Head: Normocephalic and atraumatic.  ?   Mouth/Throat:  ?   Mouth: Mucous membranes are moist.  ?Eyes:  ?   Pupils: Pupils are equal, round, and reactive to light.  ?Cardiovascular:  ?   Rate and Rhythm: Normal rate and regular rhythm.  ?Pulmonary:  ?   Effort: Pulmonary effort is normal. No respiratory distress.  ?Abdominal:  ?   Palpations: Abdomen is soft.  ?Musculoskeletal:  ?   Cervical back: Neck supple.  ?   Comments: Focused examination of the left hand with  avulsion of the skin at the tip of the finger and the distal end of the fingernail, no active bleeding, no exposed ligament or bone.  ?Lymphadenopathy:  ?   Cervical: No cervical adenopathy.  ?Skin: ?   General: Skin is warm and dry.  ?Neurological:  ?   Mental Status: He is alert and oriented to person, place, and time.  ?Psychiatric:     ?   Mood and Affect: Mood normal.  ? ? ?ED Results / Procedures / Treatments   ?Labs ?(all labs ordered are listed, but only abnormal results are displayed) ?Labs Reviewed - No data to display ? ?EKG ?None ? ?Radiology ?DG Finger Index Left ? ?Result Date: 11/07/2021 ?CLINICAL DATA:  Injury EXAM: LEFT INDEX FINGER 2+V COMPARISON:  None. 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 No fracture or dislocation is seen. The joint spaces are preserved. No radiopaque foreign body is seen. 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 No fracture, dislocation, or radiopaque foreign body is seen. Electronically Signed   By: Julian Hy M.D.   On: 11/07/2021 00:46   ? ?Procedures ?Wound repair ? ?Date/Time: 11/07/2021 1:57 AM ?Performed by: Thayer Jew  F, MD ?Authorized by: Merryl Hacker, MD  ?Consent: Verbal consent obtained. ?Consent given by: patient ?Preparation: Patient was prepped and draped in the usual sterile fashion. ?Local anesthesia used: yes ?Anesthesia: local infiltration ? ?Anesthesia: ?Local anesthesia used: yes ?Local Anesthetic: lidocaine 2% with epinephrine ?Anesthetic total (ml): 2. ?Patient tolerance: patient tolerated the procedure well with no immediate complications ?Comments: Debridement of nonviable skin at the fingertip, nonadherent dressing and splint placed ? ?  ? ? ?Medications Ordered in ED ?Medications  ?lidocaine HCl (PF) (XYLOCAINE) 2 % injection 10 mL (has no administration in time range)  ?lidocaine HCl (PF) (XYLOCAINE) 2 % injection (has no administration in time range)  ?povidone-iodine (BETADINE) 10 %  external solution (has no administration in time range)  ?Tdap (BOOSTRIX) injection 0.5 mL (0.5 mLs Intramuscular Given 11/07/21 0016)  ? ? ?ED Course/ Medical Decision Making/ A&P ?  ?                        ?Medical Decision Making ?Amount and/or Complexity of Data Reviewed ?Radiology: ordered. ? ?Risk ?Prescription drug management. ? ? ?This patient presents to the ED for concern of finger injury, this involves an extensive number of treatment options, and is a complaint that carries with it a high risk of complications and morbidity.  The differential diagnosis includes skin avulsion, laceration, nailbed injury, open fracture ? ?MDM:   ? ?This is a 36 year old male who presents with finger injury.  He is nontoxic.  X-rays obtained.  No tuft fracture or fracture.  The avulsed skin does not appear viable.  Do not feel it is amenable to repair.  Wound was debrided and nonviable tissue was removed.  Patient tolerated well.  We discussed wound care.  He was provided with a splint. ?(Labs, imaging) ? ?Labs: ?I Ordered, and personally interpreted labs.  The pertinent results include: None ? ?Imaging Studies ordered: ?I ordered imaging studies including x-rays without fracture ?I independently visualized and interpreted imaging. ?I agree with the radiologist interpretation ? ?Additional history obtained from friend at bedside.  External records from outside source obtained and reviewed including prior evaluation ? ?Critical Interventions: ?Tdap ? ?Consultations: ?I requested consultation with the NA,  and discussed lab and imaging findings as well as pertinent plan - they recommend: N/A ? ?Cardiac Monitoring: ?The patient was maintained on a cardiac monitor.  I personally viewed and interpreted the cardiac monitored which showed an underlying rhythm of: Normal sinus rhythm ? ?Reevaluation: ?After the interventions noted above, I reevaluated the patient and found that they have :improved ? ? ?Considered admission for:  N/A ? ?Social Determinants of Health: ?Lives independent ? ?Disposition: Discharge ? ?Co morbidities that complicate the patient evaluation ? ?Past Medical History:  ?Diagnosis Date  ? Epilepsy ([place])   ? Renal disorder   ? Kidney Stone 2008  ? RUQ pain 08/06/2019  ? Seizures ([place])   ?  ? ?Medicines ?Meds ordered this encounter  ?Medications  ? Tdap (BOOSTRIX) injection 0.5 mL  ? lidocaine HCl (PF) (XYLOCAINE) 2 % injection 10 mL  ? lidocaine HCl (PF) (XYLOCAINE) 2 % injection  ?  Rush, Darrick Meigs A: cabinet override  ? povidone-iodine (BETADINE) 10 % external solution  ?  Pruitt, Ginger M: cabinet override  ?  ?I have reviewed the patients home medicines and have made adjustments as needed ? ?Problem List / ED Course: ?Problem List Items Addressed This Visit   ?None ?Visit Diagnoses   ? ?  Injury of finger of left hand, initial encounter    -  Primary  ? ?  ?  ? ? ? ? ? ? ? ? ? ? ? ? ?Final Clinical Impression(s) / ED Diagnoses ?Final diagnoses:  ?Injury of finger of left hand, initial encounter  ? ? ?Rx / DC Orders ?ED Discharge Orders   ? ? None  ? ?  ? ? ?  ?Merryl Hacker, MD ?11/07/21 0201 ? ?

## 2021-11-08 ENCOUNTER — Telehealth: Payer: Self-pay

## 2021-11-08 NOTE — Telephone Encounter (Signed)
Transition Care Management Follow-up Telephone Call ?Date of discharge and from where: 11/07/2021 from [facility] ?How have you been since you were released from the hospital? Patient stated that he is feeling better and did not have any questions or concerns at this time.  ?Any questions or concerns? No ? ?Items Reviewed: ?Did the pt receive and understand the discharge instructions provided? Yes  ?Medications obtained and verified? Yes  ?Other? No  ?Any new allergies since your discharge? No  ?Dietary orders reviewed? No ?Do you have support at home? Yes  ? ?Functional Questionnaire: (I = Independent and D = Dependent) ?ADLs: I ? ?Bathing/Dressing- I ? ?Meal Prep- I ? ?Eating- I ? ?Maintaining continence- I ? ?Transferring/Ambulation- I ? ?Managing Meds- I ? ? ?Follow up appointments reviewed: ? ?PCP Hospital f/u appt confirmed? No   ?Specialist Hospital f/u appt confirmed? No   ?Are transportation arrangements needed? No  ?If their condition worsens, is the pt aware to call PCP or go to the Emergency Dept.? Yes ?Was the patient provided with contact information for the PCP's office or ED? Yes ?Was to pt encouraged to call back with questions or concerns? Yes ? ?

## 2022-01-10 DIAGNOSIS — H6123 Impacted cerumen, bilateral: Secondary | ICD-10-CM | POA: Diagnosis not present

## 2022-01-10 DIAGNOSIS — H900 Conductive hearing loss, bilateral: Secondary | ICD-10-CM | POA: Diagnosis not present

## 2022-02-02 ENCOUNTER — Encounter: Payer: Medicaid Other | Admitting: Nurse Practitioner

## 2022-02-23 ENCOUNTER — Encounter: Payer: Medicaid Other | Admitting: Nurse Practitioner

## 2022-02-23 ENCOUNTER — Encounter: Payer: Self-pay | Admitting: Nurse Practitioner

## 2022-03-02 ENCOUNTER — Emergency Department (HOSPITAL_COMMUNITY)
Admission: EM | Admit: 2022-03-02 | Discharge: 2022-03-02 | Disposition: A | Discharge status: Home / Self Care | Payer: Medicaid Other | Attending: Emergency Medicine | Admitting: Emergency Medicine

## 2022-03-02 ENCOUNTER — Emergency Department (HOSPITAL_COMMUNITY): Payer: Medicaid Other

## 2022-03-02 ENCOUNTER — Other Ambulatory Visit: Payer: Self-pay

## 2022-03-02 ENCOUNTER — Encounter (HOSPITAL_COMMUNITY): Payer: Self-pay | Admitting: Emergency Medicine

## 2022-03-02 DIAGNOSIS — W271XXA Contact with garden tool, initial encounter: Secondary | ICD-10-CM | POA: Diagnosis not present

## 2022-03-02 DIAGNOSIS — M25532 Pain in left wrist: Secondary | ICD-10-CM | POA: Diagnosis not present

## 2022-03-02 DIAGNOSIS — Y92007 Garden or yard of unspecified non-institutional (private) residence as the place of occurrence of the external cause: Secondary | ICD-10-CM | POA: Insufficient documentation

## 2022-03-02 DIAGNOSIS — Y93H2 Activity, gardening and landscaping: Secondary | ICD-10-CM | POA: Diagnosis not present

## 2022-03-02 DIAGNOSIS — S63502A Unspecified sprain of left wrist, initial encounter: Secondary | ICD-10-CM | POA: Insufficient documentation

## 2022-03-02 DIAGNOSIS — S6992XA Unspecified injury of left wrist, hand and finger(s), initial encounter: Secondary | ICD-10-CM | POA: Diagnosis present

## 2022-03-02 MED ORDER — NAPROXEN 500 MG PO TABS: 500.0000 mg | ORAL_TABLET | Freq: Two times a day (BID) | ORAL | 0 refills | Status: DC

## 2022-03-02 NOTE — ED Triage Notes (Signed)
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

## 2022-03-02 NOTE — ED Provider Notes (Signed)
[facility] EMERGENCY DEPARTMENT Provider Note   CSN: [id number] Arrival date & time: 03/02/22  0118     History  Chief Complaint  Patient presents with   Arm Pain    Carl Ball is a 36 y.o. male.  Patient is a 36 year old male with past medical history of seizures.  Patient presenting today with complaints of a left wrist injury.  Yesterday, he was using an Building services engineer to digging holes to plant bushes in a flower garden when the walker snagged on a root and jerked his left wrist in an awkward way.  He has been having pain since.  He denies any numbness or tingling.  He denies any weakness.  There are no aggravating or alleviating factors.  The history is provided by the patient.       Home Medications Prior to Admission medications   Medication Sig Start Date End Date Taking? Authorizing Provider  acetaminophen (TYLENOL 8 HOUR) 650 MG CR tablet Take 1 tablet (650 mg total) by mouth every 8 (eight) hours as needed for pain. 02/02/21   Heather Roberts, NP  triamcinolone cream (KENALOG) 0.1 % APPLY 1 APPLICATION TOPICALLY TWICE DAILY 02/24/21   Heather Roberts, NP      Allergies    Aspirin    Review of Systems   Review of Systems  All other systems reviewed and are negative.   Physical Exam Updated Vital Signs BP (!) 138/92 (BP Location: Right Arm)   Pulse 72   Temp 97.7 F (36.5 C) (Oral)   Resp 18   Ht 5\' 6"  (1.676 m)   Wt 101.1 kg   SpO2 100%   BMI 35.98 kg/m  Physical Exam Vitals and nursing note reviewed.  Constitutional:      General: He is not in acute distress.    Appearance: Normal appearance. He is not ill-appearing.  HENT:     Head: Normocephalic and atraumatic.  Pulmonary:     Effort: Pulmonary effort is normal.  Musculoskeletal:     Comments: The left wrist appears grossly normal.  There is no significant swelling or deformity.  Capillary refill is brisk to all fingertips and sensation is intact throughout.  He is able to flex and extend the wrist  without difficulty.  Skin:    General: Skin is warm and dry.  Neurological:     Mental Status: He is alert.     ED Results / Procedures / Treatments   Labs (all labs ordered are listed, but only abnormal results are displayed) Labs Reviewed - No data to display  EKG None  Radiology DG Wrist Complete Left  Result Date: 03/02/2022 CLINICAL DATA:  Left wrist pain following repetitive trauma, initial encounter EXAM: LEFT WRIST - COMPLETE 3+ VIEW COMPARISON:  None Available. FINDINGS: There is no evidence of fracture or dislocation. There is no evidence of arthropathy or other focal bone abnormality. Soft tissues are unremarkable. IMPRESSION: No acute abnormality noted. Electronically Signed   By: 03/04/2022 M.D.   On: 03/02/2022 02:21    Procedures Procedures    Medications Ordered in ED Medications - No data to display  ED Course/ Medical Decision Making/ A&P  Injury to be treated as a sprain with a splint, NSAIDs, rest, and follow-up as needed.  X-rays are negative showing no evidence for fracture or dislocation.  I doubt ligamentous injury.  Final Clinical Impression(s) / ED Diagnoses Final diagnoses:  None    Rx / DC Orders ED Discharge Orders  None         Geoffery Lyons, MD 03/02/22 [phone]

## 2022-03-02 NOTE — Discharge Instructions (Addendum)
Begin taking naproxen as prescribed.  Wear wrist splint for comfort and support for the next week, then gradually reintroduce activity as tolerated.  Follow-up with your primary doctor if symptoms are not improving in the next 1 to 2 weeks.

## 2022-03-07 ENCOUNTER — Encounter (HOSPITAL_COMMUNITY): Payer: Self-pay | Admitting: Emergency Medicine

## 2022-03-07 ENCOUNTER — Other Ambulatory Visit: Payer: Self-pay

## 2022-03-07 DIAGNOSIS — Z5321 Procedure and treatment not carried out due to patient leaving prior to being seen by health care provider: Secondary | ICD-10-CM | POA: Diagnosis not present

## 2022-03-07 DIAGNOSIS — M25532 Pain in left wrist: Secondary | ICD-10-CM | POA: Diagnosis not present

## 2022-03-07 NOTE — ED Triage Notes (Signed)
Pt c/o continued left wrist pain from previous visit.

## 2022-03-07 NOTE — ED Notes (Signed)
Pt is upset with wait time and will not sign waiver.

## 2022-03-08 ENCOUNTER — Emergency Department (HOSPITAL_COMMUNITY)
Admission: EM | Admit: 2022-03-08 | Discharge: 2022-03-08 | Discharge status: Against Medical Advice | Payer: Medicaid Other | Attending: Emergency Medicine | Admitting: Emergency Medicine

## 2022-04-06 ENCOUNTER — Ambulatory Visit (INDEPENDENT_AMBULATORY_CARE_PROVIDER_SITE_OTHER): Payer: Medicaid Other | Admitting: Nurse Practitioner

## 2022-04-06 ENCOUNTER — Encounter: Payer: Self-pay | Admitting: Nurse Practitioner

## 2022-04-06 VITALS — BP 113/68 | HR 68 | Resp 16 | Ht 68.0 in | Wt 224.0 lb

## 2022-04-06 DIAGNOSIS — H6122 Impacted cerumen, left ear: Secondary | ICD-10-CM | POA: Diagnosis not present

## 2022-04-06 DIAGNOSIS — E669 Obesity, unspecified: Secondary | ICD-10-CM

## 2022-04-06 DIAGNOSIS — Z0000 Encounter for general adult medical examination without abnormal findings: Secondary | ICD-10-CM | POA: Diagnosis not present

## 2022-04-06 DIAGNOSIS — Z0001 Encounter for general adult medical examination with abnormal findings: Secondary | ICD-10-CM | POA: Insufficient documentation

## 2022-04-06 DIAGNOSIS — Z1159 Encounter for screening for other viral diseases: Secondary | ICD-10-CM | POA: Diagnosis not present

## 2022-04-06 DIAGNOSIS — G40909 Epilepsy, unspecified, not intractable, without status epilepticus: Secondary | ICD-10-CM | POA: Diagnosis not present

## 2022-04-06 DIAGNOSIS — H6120 Impacted cerumen, unspecified ear: Secondary | ICD-10-CM

## 2022-04-06 HISTORY — DX: Impacted cerumen, unspecified ear: H61.20

## 2022-04-06 NOTE — Assessment & Plan Note (Signed)
Wt Readings from Last 3 Encounters:  04/06/22 224 lb (101.6 kg)  03/07/22 222 lb 14.2 oz (101.1 kg)  03/02/22 222 lb 14.4 oz (101.1 kg)  Does a lot of walking exercise daily Patient counseled on low-carb modified diet encouraged to engage in regular moderate to vigorous exercises at least 150 minutes weekly

## 2022-04-06 NOTE — Assessment & Plan Note (Signed)
He declined ear flushing in the office today, has used debrox ear wax solution in the past, patient encouraged to get the med and use as needed.

## 2022-04-06 NOTE — Assessment & Plan Note (Signed)
He denies seizure activities in many years ,currently not on medications. continue to monitor

## 2022-04-06 NOTE — Assessment & Plan Note (Signed)
Annual exam as documented.  Counseling done include healthy lifestyle involving committing to 150 minutes of exercise per week, heart healthy diet, and attaining healthy weight. The importance of adequate sleep also discussed.  Regular use of seat belt and home safety were also discussed . Changes in health habits are decided on by patient with goals and time frames set for achieving them. Immunization needs are specifically addressed at this visit.  Patient refused flu vaccine said he never had the vaccine patient was encouraged to consider getting the flu vaccine

## 2022-04-06 NOTE — Progress Notes (Signed)
Established Patient Office Visit  Subjective:  Patient ID: Carl Ball, male    DOB: 11-12-1985  Age: 36 y.o. MRN: [id number]  CC:  Chief Complaint  Patient presents with   Annual Exam    HPI Carl Ball is a 36 y.o. male with past medical history of seizures who presents for annual physical examination.  Patient denies any complaints today states that he does a lot of walking exercises daily, he has been eating more of Mediterranean diets.   Due for flu vaccine patient declined flu vaccine today.   Past Medical History:  Diagnosis Date   Epilepsy [facility])    Renal disorder    Kidney Stone 2008   RUQ pain 08/06/2019   Seizures (HCC)     History reviewed. No pertinent surgical history.  Family History  Problem Relation Age of Onset   Heart failure Mother    Osteoarthritis Mother    Hypertension Mother    Diabetes Father    Osteoarthritis Father    Hypertension Father    Heart disease Maternal Grandmother    Breast cancer Maternal Grandmother     Social History   Socioeconomic History   Marital status: Significant Other    Spouse name: Not on file   Number of children: 2   Years of education: Not on file   Highest education level: 11th grade  Occupational History   Not on file  Tobacco Use   Smoking status: Never   Smokeless tobacco: Never  Vaping Use   Vaping Use: Never used  Substance and Sexual Activity   Alcohol use: Yes    Comment: occasionally   Drug use: No   Sexual activity: Yes  Other Topics Concern   Not on file  Social History Narrative   Lives with significant other Olivia    2 biological   8- Rachel   6- Santonio    1 step child- Sophia who is 23      Enjoys: loves being outside, enjoys building       Diet: eats all food groups outside, enjoys salty foods, avoids candy and sugar   Caffeine:  Enjoys starbucks daily, sweet tea, cheerwine    Water: 2-3 20 oz daily       Wears seat belt    Smoke detectors and carbon monoxide at  home    Does not use phone while driving    Social Determinants of Health   Financial Resource Strain: Low Risk  (08/06/2019)   Overall Financial Resource Strain (CARDIA)    Difficulty of Paying Living Expenses: Not hard at all  Food Insecurity: No Food Insecurity (08/06/2019)   Hunger Vital Sign    Worried About Running Out of Food in the Last Year: Never true    Ran Out of Food in the Last Year: Never true  Transportation Needs: No Transportation Needs (08/06/2019)   PRAPARE - Administrator, Civil Service (Medical): No    Lack of Transportation (Non-Medical): No  Physical Activity: Inactive (08/06/2019)   Exercise Vital Sign    Days of Exercise per Week: 0 days    Minutes of Exercise per Session: 0 min  Stress: No Stress Concern Present (08/06/2019)   [employer] of Occupational Health - Occupational Stress Questionnaire    Feeling of Stress : Not at all  Social Connections: Moderately Integrated (08/06/2019)   Social Connection and Isolation Panel [NHANES]    Frequency of Communication with Friends and Family: More than three  times a week    Frequency of Social Gatherings with Friends and Family: More than three times a week    Attends Religious Services: 1 to 4 times per year    Active Member of [employer] or Organizations: No    Attends Archivist Meetings: Never    Marital Status: Living with partner  Intimate Partner Violence: Not At Risk (08/06/2019)   Humiliation, Afraid, Rape, and Kick questionnaire    Fear of Current or Ex-Partner: No    Emotionally Abused: No    Physically Abused: No    Sexually Abused: No    Outpatient Medications Prior to Visit  Medication Sig Dispense Refill   acetaminophen (TYLENOL 8 HOUR) 650 MG CR tablet Take 1 tablet (650 mg total) by mouth every 8 (eight) hours as needed for pain. 30 tablet 0   naproxen (NAPROSYN) 500 MG tablet Take 1 tablet (500 mg total) by mouth 2 (two) times daily with a meal. 20 tablet 0    triamcinolone cream (KENALOG) 0.1 % APPLY 1 APPLICATION TOPICALLY TWICE DAILY 30 g 0   No facility-administered medications prior to visit.    Allergies  Allergen Reactions   Aspirin Other (See Comments)    Causing Seizures: Epilepsy    ROS Review of Systems  Constitutional: Negative.  Negative for activity change, appetite change and chills.  HENT: Negative.  Negative for congestion, dental problem and drooling.   Eyes: Negative.  Negative for pain, discharge and itching.  Respiratory: Negative.  Negative for apnea, choking, chest tightness, shortness of breath and wheezing.   Cardiovascular: Negative.  Negative for chest pain, palpitations and leg swelling.  Gastrointestinal: Negative.  Negative for abdominal distention, abdominal pain and anal bleeding.  Endocrine: Negative.  Negative for cold intolerance, heat intolerance and polydipsia.  Genitourinary: Negative.  Negative for difficulty urinating, dysuria and enuresis.  Musculoskeletal: Negative.  Negative for arthralgias, back pain and gait problem.  Skin: Negative.  Negative for color change and pallor.  Neurological: Negative.  Negative for dizziness, facial asymmetry and headaches.  Hematological: Negative.  Negative for adenopathy. Does not bruise/bleed easily.  Psychiatric/Behavioral: Negative.  Negative for agitation, behavioral problems, confusion and decreased concentration.       Objective:    Physical Exam Constitutional:      General: He is not in acute distress.    Appearance: Normal appearance. He is not ill-appearing, toxic-appearing or diaphoretic.  HENT:     Head: Normocephalic and atraumatic.     Right Ear: Tympanic membrane, ear canal and external ear normal. There is no impacted cerumen.     Left Ear: Tympanic membrane, ear canal and external ear normal. There is impacted cerumen.     Nose: No congestion or rhinorrhea.     Mouth/Throat:     Mouth: Mucous membranes are moist.     Pharynx: Oropharynx  is clear. No oropharyngeal exudate or posterior oropharyngeal erythema.  Eyes:     General:        Right eye: No discharge.        Left eye: No discharge.     Extraocular Movements: Extraocular movements intact.     Conjunctiva/sclera: Conjunctivae normal.     Pupils: Pupils are equal, round, and reactive to light.  Neck:     Vascular: No carotid bruit.  Cardiovascular:     Rate and Rhythm: Normal rate and regular rhythm.     Pulses: Normal pulses.     Heart sounds: Normal heart sounds. No murmur heard.  No friction rub. No gallop.  Pulmonary:     Effort: Pulmonary effort is normal. No respiratory distress.     Breath sounds: Normal breath sounds. No stridor. No wheezing, rhonchi or rales.  Chest:     Chest wall: No tenderness.  Abdominal:     General: There is no distension.     Palpations: Abdomen is soft. There is no mass.     Tenderness: There is no abdominal tenderness. There is no right CVA tenderness, left CVA tenderness, guarding or rebound.     Hernia: No hernia is present.  Musculoskeletal:        General: No swelling, tenderness, deformity or signs of injury. Normal range of motion.     Cervical back: Normal range of motion and neck supple. No rigidity or tenderness.     Right lower leg: No edema.     Left lower leg: No edema.  Lymphadenopathy:     Cervical: No cervical adenopathy.  Skin:    General: Skin is warm and dry.     Capillary Refill: Capillary refill takes less than 2 seconds.     Coloration: Skin is not jaundiced or pale.     Findings: No bruising, erythema, lesion or rash.  Neurological:     General: No focal deficit present.     Mental Status: He is alert and oriented to person, place, and time.     Cranial Nerves: No cranial nerve deficit.     Sensory: No sensory deficit.     Motor: No weakness.     Coordination: Coordination normal.     Gait: Gait normal.     Deep Tendon Reflexes: Reflexes normal.  Psychiatric:        Mood and Affect: Mood  normal.        Behavior: Behavior normal.        Thought Content: Thought content normal.        Judgment: Judgment normal.     BP 113/68   Pulse 68   Resp 16   Ht $R'5\' 8"'Ay$  (1.727 m)   Wt 224 lb (101.6 kg)   SpO2 98%   BMI 34.06 kg/m  Wt Readings from Last 3 Encounters:  04/06/22 224 lb (101.6 kg)  03/07/22 222 lb 14.2 oz (101.1 kg)  03/02/22 222 lb 14.4 oz (101.1 kg)    No results found for: "TSH" Lab Results  Component Value Date   WBC 8.8 03/07/2021   HGB 15.1 03/07/2021   HCT 45.0 03/07/2021   MCV 95.7 03/07/2021   PLT 266 03/07/2021   Lab Results  Component Value Date   NA 140 03/07/2021   K 4.5 03/07/2021   CO2 27 03/07/2021   GLUCOSE 99 03/07/2021   BUN 12 03/07/2021   CREATININE 1.20 03/07/2021   BILITOT 2.0 (H) 03/07/2021   ALKPHOS 91 03/07/2021   AST 18 03/07/2021   ALT 19 03/07/2021   PROT 6.7 03/07/2021   ALBUMIN 4.4 03/07/2021   CALCIUM 9.2 03/07/2021   ANIONGAP 7 03/07/2021   Lab Results  Component Value Date   CHOL 111 02/05/2020   Lab Results  Component Value Date   HDL 44 02/05/2020   Lab Results  Component Value Date   LDLCALC 51 02/05/2020   Lab Results  Component Value Date   TRIG 77 02/05/2020   Lab Results  Component Value Date   CHOLHDL 2.5 02/05/2020   Lab Results  Component Value Date   HGBA1C 5.2 02/05/2020  Assessment & Plan:   Problem List Items Addressed This Visit       Nervous and Auditory   Seizure disorder [facility])    He denies seizure activities in many years ,currently not on medications. continue to monitor       Excess wax in ear    He declined ear flushing in the office today, has used debrox ear wax solution in the past, patient encouraged to get the med and use as needed.         Other   Obesity (BMI 35.0-39.9 without comorbidity)    Wt Readings from Last 3 Encounters:  04/06/22 224 lb (101.6 kg)  03/07/22 222 lb 14.2 oz (101.1 kg)  03/02/22 222 lb 14.4 oz (101.1 kg)  Does a lot of  walking exercise daily Patient counseled on low-carb modified diet encouraged to engage in regular moderate to vigorous exercises at least 150 minutes weekly      Annual physical exam - Primary    Annual exam as documented.  Counseling done include healthy lifestyle involving committing to 150 minutes of exercise per week, heart healthy diet, and attaining healthy weight. The importance of adequate sleep also discussed.  Regular use of seat belt and home safety were also discussed . Changes in health habits are decided on by patient with goals and time frames set for achieving them. Immunization needs are specifically addressed at this visit.  Patient refused flu vaccine said he never had the vaccine patient was encouraged to consider getting the flu vaccine       Relevant Orders   HIV antibody (with reflex)   Hepatitis C Antibody   CBC with Differential   CMP14+EGFR   TSH   Lipid Profile   HgB A1c   Vitamin D (25 hydroxy)    No orders of the defined types were placed in this encounter.   Follow-up: Return in about 1 year (around 04/07/2023).    Renee Rival, FNP

## 2022-04-06 NOTE — Patient Instructions (Signed)

## 2022-04-07 ENCOUNTER — Other Ambulatory Visit: Payer: Self-pay | Admitting: Nurse Practitioner

## 2022-04-07 DIAGNOSIS — Z1159 Encounter for screening for other viral diseases: Secondary | ICD-10-CM

## 2022-04-07 LAB — CMP14+EGFR
ALT: 23 IU/L (ref 0–44)
AST: 20 IU/L (ref 0–40)
Albumin/Globulin Ratio: 2.8 — ABNORMAL HIGH (ref 1.2–2.2)
Albumin: 4.7 g/dL (ref 4.1–5.1)
Alkaline Phosphatase: 104 IU/L (ref 44–121)
BUN/Creatinine Ratio: 12 (ref 9–20)
BUN: 11 mg/dL (ref 6–20)
Bilirubin Total: 1.4 mg/dL — ABNORMAL HIGH (ref 0.0–1.2)
CO2: 25 mmol/L (ref 20–29)
Calcium: 9.5 mg/dL (ref 8.7–10.2)
Chloride: 104 mmol/L (ref 96–106)
Creatinine, Ser: 0.92 mg/dL (ref 0.76–1.27)
Globulin, Total: 1.7 g/dL (ref 1.5–4.5)
Glucose: 97 mg/dL (ref 70–99)
Potassium: 5 mmol/L (ref 3.5–5.2)
Sodium: 142 mmol/L (ref 134–144)
Total Protein: 6.4 g/dL (ref 6.0–8.5)
eGFR: 111 mL/min/{1.73_m2} (ref >59)

## 2022-04-07 LAB — HIV ANTIBODY (ROUTINE TESTING W REFLEX): HIV Screen 4th Generation wRfx: NONREACTIVE

## 2022-04-07 LAB — CBC WITH DIFFERENTIAL/PLATELET
Basophils Absolute: 0 10*3/uL (ref 0.0–0.2)
Basos: 1 %
EOS (ABSOLUTE): 0.3 10*3/uL (ref 0.0–0.4)
Eos: 5 %
Hematocrit: 42.2 % (ref 37.5–51.0)
Hemoglobin: 14.7 g/dL (ref 13.0–17.7)
Immature Grans (Abs): 0 10*3/uL (ref 0.0–0.1)
Immature Granulocytes: 0 %
Lymphocytes Absolute: 1.3 10*3/uL (ref 0.7–3.1)
Lymphs: 27 %
MCH: 31.8 pg (ref 26.6–33.0)
MCHC: 34.8 g/dL (ref 31.5–35.7)
MCV: 91 fL (ref 79–97)
Monocytes Absolute: 0.4 10*3/uL (ref 0.1–0.9)
Monocytes: 8 %
Neutrophils Absolute: 3 10*3/uL (ref 1.4–7.0)
Neutrophils: 59 %
Platelets: 235 10*3/uL (ref 150–450)
RBC: 4.62 x10E6/uL (ref 4.14–5.80)
RDW: 11.8 % (ref 11.6–15.4)
WBC: 5.1 10*3/uL (ref 3.4–10.8)

## 2022-04-07 LAB — LIPID PANEL
Chol/HDL Ratio: 2.3 ratio (ref 0.0–5.0)
Cholesterol, Total: 109 mg/dL (ref 100–199)
HDL: 47 mg/dL (ref >39)
LDL Chol Calc (NIH): 46 mg/dL (ref 0–99)
Triglycerides: 82 mg/dL (ref 0–149)
VLDL Cholesterol Cal: 16 mg/dL (ref 5–40)

## 2022-04-07 LAB — VITAMIN D 25 HYDROXY (VIT D DEFICIENCY, FRACTURES): Vit D, 25-Hydroxy: 44.9 ng/mL (ref 30.0–100.0)

## 2022-04-07 LAB — TSH: TSH: 1.21 u[IU]/mL (ref 0.450–4.500)

## 2022-04-07 LAB — HEPATITIS C ANTIBODY: Hep C Virus Ab: UNDETERMINED — AB

## 2022-04-07 LAB — HEMOGLOBIN A1C
Est. average glucose Bld gHb Est-mCnc: 103 mg/dL
Hgb A1c MFr Bld: 5.2 % (ref 4.8–5.6)

## 2022-04-07 NOTE — Progress Notes (Signed)
Hep c test result is neither positive or negative, I have added on HCV RNA, please notify labs , thanks

## 2022-04-11 LAB — HCV RNA DIAGNOSIS, NAA: HCV RNA, Quantitation: NOT DETECTED IU/mL

## 2022-04-11 LAB — SPECIMEN STATUS REPORT

## 2022-04-11 NOTE — Progress Notes (Signed)
bilirubin is elevated but better than it was last year, patient should avoid alcohol, report abdominal pain.   Other labs are normal

## 2022-04-12 DIAGNOSIS — H6122 Impacted cerumen, left ear: Secondary | ICD-10-CM | POA: Diagnosis not present

## 2022-07-06 DIAGNOSIS — H6123 Impacted cerumen, bilateral: Secondary | ICD-10-CM | POA: Diagnosis not present

## 2022-07-14 ENCOUNTER — Other Ambulatory Visit: Payer: Self-pay

## 2022-07-14 ENCOUNTER — Emergency Department (HOSPITAL_COMMUNITY)
Admission: EM | Admit: 2022-07-14 | Discharge: 2022-07-15 | Disposition: A | Discharge status: Home / Self Care | Payer: Medicaid Other | Attending: Emergency Medicine | Admitting: Emergency Medicine

## 2022-07-14 ENCOUNTER — Encounter (HOSPITAL_COMMUNITY): Payer: Self-pay

## 2022-07-14 DIAGNOSIS — Z20822 Contact with and (suspected) exposure to covid-19: Secondary | ICD-10-CM | POA: Diagnosis not present

## 2022-07-14 DIAGNOSIS — J101 Influenza due to other identified influenza virus with other respiratory manifestations: Secondary | ICD-10-CM | POA: Diagnosis not present

## 2022-07-14 DIAGNOSIS — R059 Cough, unspecified: Secondary | ICD-10-CM | POA: Diagnosis present

## 2022-07-14 LAB — COMPREHENSIVE METABOLIC PANEL
ALT: 20 U/L (ref 0–44)
AST: 23 U/L (ref 15–41)
Albumin: 4.4 g/dL (ref 3.5–5.0)
Alkaline Phosphatase: 80 U/L (ref 38–126)
Anion gap: 8 (ref 5–15)
BUN: 15 mg/dL (ref 6–20)
CO2: 27 mmol/L (ref 22–32)
Calcium: 9 mg/dL (ref 8.9–10.3)
Chloride: 104 mmol/L (ref 98–111)
Creatinine, Ser: 1.12 mg/dL (ref 0.61–1.24)
GFR, Estimated: >60 mL/min (ref >60)
Glucose, Bld: 103 mg/dL — ABNORMAL HIGH (ref 70–99)
Potassium: 3.9 mmol/L (ref 3.5–5.1)
Sodium: 139 mmol/L (ref 135–145)
Total Bilirubin: 1.3 mg/dL — ABNORMAL HIGH (ref 0.3–1.2)
Total Protein: 7.3 g/dL (ref 6.5–8.1)

## 2022-07-14 LAB — CBC
HCT: 44.4 % (ref 39.0–52.0)
Hemoglobin: 14.9 g/dL (ref 13.0–17.0)
MCH: 31.4 pg (ref 26.0–34.0)
MCHC: 33.6 g/dL (ref 30.0–36.0)
MCV: 93.7 fL (ref 80.0–100.0)
Platelets: 229 10*3/uL (ref 150–400)
RBC: 4.74 MIL/uL (ref 4.22–5.81)
RDW: 11.9 % (ref 11.5–15.5)
WBC: 5.5 10*3/uL (ref 4.0–10.5)
nRBC: 0 % (ref 0.0–0.2)

## 2022-07-14 LAB — RESP PANEL BY RT-PCR (RSV, FLU A&B, COVID)  RVPGX2
Influenza A by PCR: NEGATIVE
Influenza B by PCR: POSITIVE — AB
Resp Syncytial Virus by PCR: NEGATIVE
SARS Coronavirus 2 by RT PCR: NEGATIVE

## 2022-07-14 LAB — LIPASE, BLOOD: Lipase: 37 U/L (ref 11–51)

## 2022-07-14 MED ORDER — ONDANSETRON 4 MG PO TBDP
4.0000 mg | ORAL_TABLET | Freq: Once | ORAL | Status: AC | PRN
  Administered 2022-07-15: 4 mg via ORAL
  Filled 2022-07-14: qty 1

## 2022-07-14 NOTE — ED Triage Notes (Signed)
Pt arrived from home via POV w c/o emesis x 2. Pt states that he is unable to keep anything down. Pt states that he thinks he may be dehydrated because he has not voided since yesterday.

## 2022-07-15 NOTE — ED Provider Notes (Signed)
  [facility] EMERGENCY DEPARTMENT Provider Note   CSN: [id number] Arrival date & time: 07/14/22  1931     History  Chief Complaint  Patient presents with   Emesis    Carl Ball is a 36 y.o. male.  The history is provided by the patient and a relative.  Patient reports over the past day he has had cough, congestion, vomiting.  He reports fevers.  He reports diaphoresis and bodyaches.     Home Medications Prior to Admission medications   Not on File      Allergies    Aspirin    Review of Systems   Review of Systems  Physical Exam Updated Vital Signs BP 126/81 (BP Location: Left Arm)   Pulse 96   Temp 98.6 F (37 C) (Oral)   Resp 16   Ht 1.689 m (5' 6.5")   Wt 104.3 kg   SpO2 96%   BMI 36.57 kg/m  Physical Exam CONSTITUTIONAL: Well developed/well nourished HEAD: Normocephalic/atraumatic EYES: EOMI ENMT: Mucous membranes moist NECK: supple no meningeal signs CV: S1/S2 noted, no murmurs/rubs/gallops noted LUNGS: Lungs are clear to auscultation bilaterally, no apparent distress ABDOMEN: soft, nontender NEURO: Pt is awake/alert/appropriate, moves all extremitiesx4.  EXTREMITIES: pulses normal/equal, full ROM SKIN: warm, color normal PSYCH: no abnormalities of mood noted, alert and oriented to situation  ED Results / Procedures / Treatments   Labs (all labs ordered are listed, but only abnormal results are displayed) Labs Reviewed  RESP PANEL BY RT-PCR (RSV, FLU A&B, COVID)  RVPGX2 - Abnormal; Notable for the following components:      Result Value   Influenza B by PCR POSITIVE (*)    All other components within normal limits  COMPREHENSIVE METABOLIC PANEL - Abnormal; Notable for the following components:   Glucose, Bld 103 (*)    Total Bilirubin 1.3 (*)    All other components within normal limits  LIPASE, BLOOD  CBC    EKG None  Radiology No results found.  Procedures Procedures    Medications Ordered in ED Medications  ondansetron  (ZOFRAN-ODT) disintegrating tablet 4 mg (4 mg Oral Given 07/15/22 0343)    ED Course/ Medical Decision Making/ A&P                           Medical Decision Making Amount and/or Complexity of Data Reviewed Labs: ordered.  Risk Prescription drug management.   Patient overall healthy at baseline found to have influenza B.  He is in no distress.  No added lung sounds.  No hypoxia.  He is safe for outpatient management.        Final Clinical Impression(s) / ED Diagnoses Final diagnoses:  Influenza B    Rx / DC Orders ED Discharge Orders     None         Zadie Rhine, MD 07/15/22 [phone]

## 2022-10-05 DIAGNOSIS — H6123 Impacted cerumen, bilateral: Secondary | ICD-10-CM | POA: Diagnosis not present

## 2023-01-05 DIAGNOSIS — H6123 Impacted cerumen, bilateral: Secondary | ICD-10-CM | POA: Diagnosis not present

## 2023-04-07 DIAGNOSIS — H6123 Impacted cerumen, bilateral: Secondary | ICD-10-CM | POA: Diagnosis not present

## 2023-04-10 ENCOUNTER — Ambulatory Visit (INDEPENDENT_AMBULATORY_CARE_PROVIDER_SITE_OTHER): Payer: Medicaid Other | Admitting: Internal Medicine

## 2023-04-10 ENCOUNTER — Encounter: Payer: Self-pay | Admitting: Internal Medicine

## 2023-04-10 VITALS — BP 119/81 | HR 72 | Ht 68.0 in | Wt 227.8 lb

## 2023-04-10 DIAGNOSIS — E669 Obesity, unspecified: Secondary | ICD-10-CM | POA: Diagnosis not present

## 2023-04-10 DIAGNOSIS — Z13220 Encounter for screening for lipoid disorders: Secondary | ICD-10-CM

## 2023-04-10 DIAGNOSIS — E66811 Obesity, class 1: Secondary | ICD-10-CM

## 2023-04-10 DIAGNOSIS — G40909 Epilepsy, unspecified, not intractable, without status epilepticus: Secondary | ICD-10-CM | POA: Diagnosis not present

## 2023-04-10 DIAGNOSIS — Z1329 Encounter for screening for other suspected endocrine disorder: Secondary | ICD-10-CM

## 2023-04-10 DIAGNOSIS — Z131 Encounter for screening for diabetes mellitus: Secondary | ICD-10-CM

## 2023-04-10 DIAGNOSIS — Z0001 Encounter for general adult medical examination with abnormal findings: Secondary | ICD-10-CM

## 2023-04-10 DIAGNOSIS — Z2821 Immunization not carried out because of patient refusal: Secondary | ICD-10-CM | POA: Diagnosis not present

## 2023-04-10 DIAGNOSIS — Z1321 Encounter for screening for nutritional disorder: Secondary | ICD-10-CM | POA: Diagnosis not present

## 2023-04-10 NOTE — Assessment & Plan Note (Signed)
Annual exam completed today.  Available records and labs reviewed. -Repeat labs ordered -Influenza vaccine declined -We will tentatively plan for follow-up in 1 year for annual exam

## 2023-04-10 NOTE — Assessment & Plan Note (Signed)
BMI 34.6.  We discussed the need to lose weight and reviewed lifestyle modifications aimed at weight loss. -Repeat labs ordered today

## 2023-04-10 NOTE — Progress Notes (Signed)
Complete physical exam  Patient: Carl Ball   DOB: 08/18/1985   37 y.o. Male  MRN: [id number]  Subjective:    Chief Complaint  Patient presents with   Annual Exam    CPE no concerns    Carl Ball is a 37 y.o. male who presents today for a complete physical exam. He reports consuming a general diet. The patient has a physically strenuous job, but has no regular exercise apart from work.  He generally feels well. He reports sleeping well. He does not have additional problems to discuss today.    Most recent fall risk assessment:    04/10/2023    8:08 AM  Fall Risk   Falls in the past year? 0  Number falls in past yr: 0  Injury with Fall? 0  Risk for fall due to : No Fall Risks  Follow up Falls evaluation completed     Most recent depression screenings:    04/10/2023    8:08 AM 04/06/2022    8:58 AM  PHQ 2/9 Scores  PHQ - 2 Score 0 0    Vision:Not within last year  and Dental: No current dental problems and Receives regular dental care  Past Medical History:  Diagnosis Date   Epilepsy (HCC)    Excess wax in ear 04/06/2022   Plant allergic contact dermatitis 02/02/2021   Renal disorder    Kidney Stone 2008   RUQ pain 08/06/2019   Seizures (HCC)    History reviewed. No pertinent surgical history. Social History   Tobacco Use   Smoking status: Never   Smokeless tobacco: Never  Vaping Use   Vaping status: Never Used  Substance Use Topics   Alcohol use: Yes    Comment: occasionally   Drug use: No   Family History  Problem Relation Age of Onset   Heart failure Mother    Osteoarthritis Mother    Hypertension Mother    Diabetes Father    Osteoarthritis Father    Hypertension Father    Heart disease Maternal Grandmother    Breast cancer Maternal Grandmother    Allergies  Allergen Reactions   Aspirin Other (See Comments)    Causing Seizures: Epilepsy   Patient Care Team: Billie Lade, MD as PCP - General (Internal Medicine)   No  outpatient medications prior to visit.   No facility-administered medications prior to visit.   Review of Systems  Constitutional:  Negative for chills and fever.  HENT:  Negative for sore throat.   Respiratory:  Negative for cough and shortness of breath.   Cardiovascular:  Negative for chest pain, palpitations and leg swelling.  Gastrointestinal:  Negative for abdominal pain, blood in stool, constipation, diarrhea, nausea and vomiting.  Genitourinary:  Negative for dysuria and hematuria.  Musculoskeletal:  Negative for myalgias.  Skin:  Negative for itching and rash.  Neurological:  Negative for dizziness and headaches.  Psychiatric/Behavioral:  Negative for depression and suicidal ideas.       Objective:     BP 119/81 (BP Location: Left Arm, Patient Position: Sitting, Cuff Size: Large)   Pulse 72   Ht 5\' 8"  (1.727 m)   Wt 227 lb 12.8 oz (103.3 kg)   SpO2 96%   BMI 34.64 kg/m  BP Readings from Last 3 Encounters:  04/10/23 119/81  07/15/22 126/81  04/06/22 113/68   Physical Exam Vitals reviewed.  Constitutional:      General: He is not in acute distress.  Appearance: Normal appearance. He is obese. He is not ill-appearing.  HENT:     Head: Normocephalic and atraumatic.     Right Ear: External ear normal.     Left Ear: External ear normal.     Nose: Nose normal. No congestion or rhinorrhea.     Mouth/Throat:     Mouth: Mucous membranes are moist.     Pharynx: Oropharynx is clear.  Eyes:     General: No scleral icterus.    Extraocular Movements: Extraocular movements intact.     Conjunctiva/sclera: Conjunctivae normal.     Pupils: Pupils are equal, round, and reactive to light.  Cardiovascular:     Rate and Rhythm: Normal rate and regular rhythm.     Pulses: Normal pulses.     Heart sounds: Normal heart sounds. No murmur heard. Pulmonary:     Effort: Pulmonary effort is normal.     Breath sounds: Normal breath sounds. No wheezing, rhonchi or rales.   Abdominal:     General: Abdomen is flat. Bowel sounds are normal. There is no distension.     Palpations: Abdomen is soft.     Tenderness: There is no abdominal tenderness.  Musculoskeletal:        General: No swelling or deformity. Normal range of motion.     Cervical back: Normal range of motion.  Skin:    General: Skin is warm and dry.     Capillary Refill: Capillary refill takes less than 2 seconds.  Neurological:     General: No focal deficit present.     Mental Status: He is alert and oriented to person, place, and time.     Motor: No weakness.  Psychiatric:        Mood and Affect: Mood normal.        Behavior: Behavior normal.        Thought Content: Thought content normal.   Last CBC Lab Results  Component Value Date   WBC 5.5 07/14/2022   HGB 14.9 07/14/2022   HCT 44.4 07/14/2022   MCV 93.7 07/14/2022   MCH 31.4 07/14/2022   RDW 11.9 07/14/2022   PLT 229 07/14/2022   Last metabolic panel Lab Results  Component Value Date   GLUCOSE 103 (H) 07/14/2022   NA 139 07/14/2022   K 3.9 07/14/2022   CL 104 07/14/2022   CO2 27 07/14/2022   BUN 15 07/14/2022   CREATININE 1.12 07/14/2022   GFRNONAA >60 07/14/2022   CALCIUM 9.0 07/14/2022   PROT 7.3 07/14/2022   ALBUMIN 4.4 07/14/2022   LABGLOB 1.7 04/06/2022   AGRATIO 2.8 (H) 04/06/2022   BILITOT 1.3 (H) 07/14/2022   ALKPHOS 80 07/14/2022   AST 23 07/14/2022   ALT 20 07/14/2022   ANIONGAP 8 07/14/2022   Last lipids Lab Results  Component Value Date   CHOL 109 04/06/2022   HDL 47 04/06/2022   LDLCALC 46 04/06/2022   TRIG 82 04/06/2022   CHOLHDL 2.3 04/06/2022   Last hemoglobin A1c Lab Results  Component Value Date   HGBA1C 5.2 04/06/2022   Last thyroid functions Lab Results  Component Value Date   TSH 1.210 04/06/2022   Last vitamin D Lab Results  Component Value Date   VD25OH 44.9 04/06/2022       Assessment & Plan:    Routine Health Maintenance and Physical Exam  Immunization History   Administered Date(s) Administered   Tdap 08/04/2018, 11/07/2021    Health Maintenance  Topic Date Due   COVID-19  Vaccine (1 - 2023-24 season) Never done   INFLUENZA VACCINE  10/16/2023 (Originally 02/16/2023)   DTaP/Tdap/Td (3 - Td or Tdap) 11/08/2031   Hepatitis C Screening  Completed   HIV Screening  Completed   HPV VACCINES  Aged Out    Discussed health benefits of physical activity, and encouraged him to engage in regular exercise appropriate for his age and condition.  Problem List Items Addressed This Visit       Seizure disorder [facility])    Previously documented history of seizure disorder.  Reports last seizure activity in 2018.  Not currently on AED.      Obesity (BMI 30.0-34.9)    BMI 34.6.  We discussed the need to lose weight and reviewed lifestyle modifications aimed at weight loss. -Repeat labs ordered today      Encounter for well adult exam with abnormal findings - Primary    Annual exam completed today.  Available records and labs reviewed. -Repeat labs ordered -Influenza vaccine declined -We will tentatively plan for follow-up in 1 year for annual exam      Hyperbilirubinemia    Noted on previous labs.  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  Will add direct bilirubin level to repeat labs today.      Return in about 1 year (around 04/09/2024) for CPE.  Billie Lade, MD

## 2023-04-10 NOTE — Assessment & Plan Note (Signed)
Previously documented history of seizure disorder.  Reports last seizure activity in 2018.  Not currently on AED.

## 2023-04-10 NOTE — Patient Instructions (Signed)
It was a pleasure to see you today.  Thank you for giving [place] the opportunity to be involved in your care.  Below is a brief recap of your visit and next steps.  We will plan to see you again in 1 year.  Summary Annual exam completed today Repeat labs ordered We will follow up in 1 year

## 2023-04-10 NOTE — Assessment & Plan Note (Signed)
Noted on previous labs.  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  Will add direct bilirubin level to repeat labs today.

## 2023-04-11 LAB — CBC WITH DIFFERENTIAL/PLATELET
Basophils Absolute: 0 10*3/uL (ref 0.0–0.2)
Basos: 1 %
EOS (ABSOLUTE): 0.2 10*3/uL (ref 0.0–0.4)
Eos: 4 %
Hematocrit: 43.3 % (ref 37.5–51.0)
Hemoglobin: 14.2 g/dL (ref 13.0–17.7)
Immature Grans (Abs): 0 10*3/uL (ref 0.0–0.1)
Immature Granulocytes: 0 %
Lymphocytes Absolute: 1.7 10*3/uL (ref 0.7–3.1)
Lymphs: 30 %
MCH: 31 pg (ref 26.6–33.0)
MCHC: 32.8 g/dL (ref 31.5–35.7)
MCV: 95 fL (ref 79–97)
Monocytes Absolute: 0.5 10*3/uL (ref 0.1–0.9)
Monocytes: 10 %
Neutrophils Absolute: 3.2 10*3/uL (ref 1.4–7.0)
Neutrophils: 55 %
Platelets: 256 10*3/uL (ref 150–450)
RBC: 4.58 x10E6/uL (ref 4.14–5.80)
RDW: 12.1 % (ref 11.6–15.4)
WBC: 5.7 10*3/uL (ref 3.4–10.8)

## 2023-04-11 LAB — CMP14+EGFR
ALT: 17 IU/L (ref 0–44)
AST: 17 IU/L (ref 0–40)
Albumin: 4.4 g/dL (ref 4.1–5.1)
Alkaline Phosphatase: 110 IU/L (ref 44–121)
BUN/Creatinine Ratio: 10 (ref 9–20)
BUN: 9 mg/dL (ref 6–20)
Bilirubin Total: 1.3 mg/dL — ABNORMAL HIGH (ref 0.0–1.2)
CO2: 24 mmol/L (ref 20–29)
Calcium: 9.1 mg/dL (ref 8.7–10.2)
Chloride: 105 mmol/L (ref 96–106)
Creatinine, Ser: 0.92 mg/dL (ref 0.76–1.27)
Globulin, Total: 1.6 g/dL (ref 1.5–4.5)
Glucose: 94 mg/dL (ref 70–99)
Potassium: 4 mmol/L (ref 3.5–5.2)
Sodium: 143 mmol/L (ref 134–144)
Total Protein: 6 g/dL (ref 6.0–8.5)
eGFR: 111 mL/min/{1.73_m2} (ref >59)

## 2023-04-11 LAB — TSH+FREE T4
Free T4: 1.09 ng/dL (ref 0.82–1.77)
TSH: 2.08 u[IU]/mL (ref 0.450–4.500)

## 2023-04-11 LAB — LIPID PANEL
Chol/HDL Ratio: 2.4 ratio (ref 0.0–5.0)
Cholesterol, Total: 101 mg/dL (ref 100–199)
HDL: 42 mg/dL (ref >39)
LDL Chol Calc (NIH): 42 mg/dL (ref 0–99)
Triglycerides: 87 mg/dL (ref 0–149)
VLDL Cholesterol Cal: 17 mg/dL (ref 5–40)

## 2023-04-11 LAB — HEMOGLOBIN A1C
Est. average glucose Bld gHb Est-mCnc: 105 mg/dL
Hgb A1c MFr Bld: 5.3 % (ref 4.8–5.6)

## 2023-04-11 LAB — BILIRUBIN, DIRECT: Bilirubin, Direct: 0.31 mg/dL (ref 0.00–0.40)

## 2023-04-11 LAB — B12 AND FOLATE PANEL
Folate: 12.5 ng/mL (ref >3.0)
Vitamin B-12: 214 pg/mL — ABNORMAL LOW (ref 232–1245)

## 2023-04-11 LAB — VITAMIN D 25 HYDROXY (VIT D DEFICIENCY, FRACTURES): Vit D, 25-Hydroxy: 43.5 ng/mL (ref 30.0–100.0)

## 2023-04-19 ENCOUNTER — Emergency Department (HOSPITAL_COMMUNITY): Payer: Medicaid Other

## 2023-04-19 ENCOUNTER — Encounter (HOSPITAL_COMMUNITY): Payer: Self-pay

## 2023-04-19 ENCOUNTER — Emergency Department (HOSPITAL_COMMUNITY)
Admission: EM | Admit: 2023-04-19 | Discharge: 2023-04-20 | Disposition: A | Discharge status: Home / Self Care | Payer: Medicaid Other | Attending: Emergency Medicine | Admitting: Emergency Medicine

## 2023-04-19 DIAGNOSIS — S93402A Sprain of unspecified ligament of left ankle, initial encounter: Secondary | ICD-10-CM | POA: Diagnosis not present

## 2023-04-19 DIAGNOSIS — M25472 Effusion, left ankle: Secondary | ICD-10-CM | POA: Diagnosis not present

## 2023-04-19 DIAGNOSIS — X501XXA Overexertion from prolonged static or awkward postures, initial encounter: Secondary | ICD-10-CM | POA: Insufficient documentation

## 2023-04-19 DIAGNOSIS — M25572 Pain in left ankle and joints of left foot: Secondary | ICD-10-CM | POA: Diagnosis not present

## 2023-04-19 NOTE — ED Triage Notes (Signed)
Says he was working in the yard and got tripped up. Fell down injuring left ankle.   Ambulatory in triage.   Denies LOC or syncopal episodes.

## 2023-04-22 NOTE — ED Provider Notes (Signed)
  [place] EMERGENCY DEPARTMENT AT [facility] Provider Note   CSN: [id number] Arrival date & time: 04/19/23  2031     History  Chief Complaint  Patient presents with   Carl Ball is a 37 y.o. male.  Patient rolled his ankle (left) in the ER.  Has pain to the lateral malleolus area with mild edema.  Was worried that he might have fractured it so presents here for evaluation.  No other injuries.   Fall       Home Medications Prior to Admission medications   Not on File      Allergies    Aspirin    Review of Systems   Review of Systems  Physical Exam Updated Vital Signs BP 106/75   Pulse 77   Temp 98.9 F (37.2 C) (Oral)   Resp 20   Ht 5\' 8"  (1.727 m)   Wt 104.3 kg   SpO2 99%   BMI 34.97 kg/m  Physical Exam Vitals and nursing note reviewed.  Constitutional:      Appearance: He is well-developed.  HENT:     Head: Normocephalic and atraumatic.  Cardiovascular:     Rate and Rhythm: Normal rate.  Pulmonary:     Effort: Pulmonary effort is normal. No respiratory distress.  Abdominal:     General: There is no distension.  Musculoskeletal:        General: Swelling (With mild tenderness over the left posterior malleolus) present. No deformity. Normal range of motion.     Cervical back: Normal range of motion.  Skin:    General: Skin is warm and dry.  Neurological:     General: No focal deficit present.     Mental Status: He is alert.     ED Results / Procedures / Treatments   Labs (all labs ordered are listed, but only abnormal results are displayed) Labs Reviewed - No data to display  EKG None  Radiology No results found.  Procedures Procedures    Medications Ordered in ED Medications - No data to display  ED Course/ Medical Decision Making/ A&P                                 Medical Decision Making Amount and/or Complexity of Data Reviewed Radiology: ordered.   Left ankle sprain.  Discussed crutches,  Cam walker, Ace wrap and prefers Ace wrap.  States his pain is fine does not Need any medication will take Tylenol to get home.  X-rays viewed interpreted by myself without obvious fracture in the area of his pain.  Will follow-up with PCP if not improving in a week.  No proximal fibular tenderness to suggest high ankle sprain.  Final Clinical Impression(s) / ED Diagnoses Final diagnoses:  Sprain of left ankle, unspecified ligament, initial encounter    Rx / DC Orders ED Discharge Orders     None         Marsena Taff, Barbara Cower, MD 04/22/23 2349

## 2023-07-07 DIAGNOSIS — H6123 Impacted cerumen, bilateral: Secondary | ICD-10-CM | POA: Diagnosis not present

## 2023-07-17 DIAGNOSIS — J101 Influenza due to other identified influenza virus with other respiratory manifestations: Secondary | ICD-10-CM | POA: Diagnosis not present

## 2023-07-17 DIAGNOSIS — U071 COVID-19: Secondary | ICD-10-CM | POA: Diagnosis not present

## 2023-07-17 DIAGNOSIS — R0981 Nasal congestion: Secondary | ICD-10-CM | POA: Diagnosis not present

## 2023-07-17 DIAGNOSIS — J069 Acute upper respiratory infection, unspecified: Secondary | ICD-10-CM | POA: Diagnosis not present

## 2023-07-24 DIAGNOSIS — H6092 Unspecified otitis externa, left ear: Secondary | ICD-10-CM | POA: Diagnosis not present

## 2023-07-24 DIAGNOSIS — R030 Elevated blood-pressure reading, without diagnosis of hypertension: Secondary | ICD-10-CM | POA: Diagnosis not present

## 2023-08-01 DIAGNOSIS — H9202 Otalgia, left ear: Secondary | ICD-10-CM | POA: Diagnosis not present

## 2023-08-01 DIAGNOSIS — H903 Sensorineural hearing loss, bilateral: Secondary | ICD-10-CM | POA: Diagnosis not present

## 2023-08-01 DIAGNOSIS — H6123 Impacted cerumen, bilateral: Secondary | ICD-10-CM | POA: Diagnosis not present

## 2023-08-07 ENCOUNTER — Other Ambulatory Visit: Payer: Self-pay

## 2023-08-07 ENCOUNTER — Emergency Department (HOSPITAL_COMMUNITY)
Admission: EM | Admit: 2023-08-07 | Discharge: 2023-08-07 | Disposition: A | Discharge status: Home / Self Care | Payer: Medicaid Other | Attending: Emergency Medicine | Admitting: Emergency Medicine

## 2023-08-07 ENCOUNTER — Encounter (HOSPITAL_COMMUNITY): Payer: Self-pay

## 2023-08-07 DIAGNOSIS — H6690 Otitis media, unspecified, unspecified ear: Secondary | ICD-10-CM

## 2023-08-07 DIAGNOSIS — R059 Cough, unspecified: Secondary | ICD-10-CM | POA: Diagnosis not present

## 2023-08-07 DIAGNOSIS — H6593 Unspecified nonsuppurative otitis media, bilateral: Secondary | ICD-10-CM | POA: Diagnosis not present

## 2023-08-07 DIAGNOSIS — H6691 Otitis media, unspecified, right ear: Secondary | ICD-10-CM | POA: Diagnosis not present

## 2023-08-07 DIAGNOSIS — H9201 Otalgia, right ear: Secondary | ICD-10-CM | POA: Diagnosis present

## 2023-08-07 DIAGNOSIS — H6693 Otitis media, unspecified, bilateral: Secondary | ICD-10-CM | POA: Diagnosis not present

## 2023-08-07 MED ORDER — CEFDINIR 300 MG PO CAPS: 600.0000 mg | ORAL_CAPSULE | Freq: Every day | ORAL | 0 refills | Status: AC

## 2023-08-07 NOTE — ED Notes (Signed)
Patient verbalizes understanding of discharge instructions. Opportunity for questioning and answers were provided. Armband removed by staff, pt discharged from ED. Pt ambulatory to ED waiting room with steady gait.  

## 2023-08-07 NOTE — Discharge Instructions (Signed)
You were seen today for right ear pain.  Your ears did look a little red on that right side.  Effusions were also noted on both sides demonstrating that some fluid was present.  Did not look incredibly inflamed and have low concerns for any emergent pathologies at this time.  However due to you having recently taken antibiotics, I am prescribing you cefdinir.  You are to take 600 mg once a day for the next 7 days.  I would also recommend that you follow-up with your ENT to get evaluated for further testing as talked about previously.  If you begin to have worsening fever, headache, ear pain, shortness of breath, chest pain, vision changes return to the ER for immediate evaluation  It was a pleasure seeing you in the ER today.

## 2023-08-07 NOTE — ED Provider Notes (Addendum)
[facility] Provider Note   CSN: [id number] Arrival date & time: 08/07/23  1711     History  Chief Complaint  Patient presents with   Otalgia    [name] is a 38 y.o. male.   Otalgia  Patient presents to the ED today complaining of a 3-week history of right ear pain and odynophagia.  Also has had a 3 history of congestion, cough.  He has been previously seen by ENT and PCP.  Shown to have normal workup.  Per ENT needing further evaluation via flexible nasolaryngoscopy.  Came here today due to confusion of whether he should follow-up with ENT or present to the ED for continued symptoms.  Denies fever, headache, vertigo, vision changes, hearing loss, shortness of breath,    Home Medications Prior to Admission medications   Medication Sig Start Date End Date Taking? Authorizing Provider  cefdinir (OMNICEF) 300 MG capsule Take 2 capsules (600 mg total) by mouth daily for 7 days. 08/07/23 08/14/23 Yes Lunette Stands, PA-C      Allergies    Aspirin    Review of Systems   Review of Systems  HENT:  Positive for ear pain.   All other systems reviewed and are negative.   Physical Exam Updated Vital Signs BP 134/85 (BP Location: Right Arm)   Pulse 86   Temp 98.5 F (36.9 C) (Oral)   Resp 16   Wt 104 kg   SpO2 100%   BMI 34.86 kg/m  Physical Exam Constitutional:      General: He is not in acute distress.    Appearance: He is not ill-appearing.  HENT:     Right Ear: Hearing, ear canal and external ear normal. No decreased hearing noted. No drainage or swelling. A middle ear effusion is present. No foreign body. No hemotympanum. Tympanic membrane is erythematous. Tympanic membrane is not perforated or bulging.     Left Ear: Hearing, ear canal and external ear normal. No decreased hearing noted. No drainage or swelling. A middle ear effusion is present. No foreign body. No hemotympanum. Tympanic membrane is not perforated or  bulging.     Nose: Congestion and rhinorrhea present.     Mouth/Throat:     Mouth: Mucous membranes are moist.     Pharynx: Oropharynx is clear. No oropharyngeal exudate or posterior oropharyngeal erythema.  Eyes:     General: No scleral icterus.       Left eye: No discharge.     Extraocular Movements: Extraocular movements intact.     Conjunctiva/sclera: Conjunctivae normal.     Pupils: Pupils are equal, round, and reactive to light.  Cardiovascular:     Rate and Rhythm: Normal rate and regular rhythm.     Pulses: Normal pulses.     Heart sounds: Normal heart sounds. No murmur heard.    No friction rub. No gallop.  Pulmonary:     Effort: Pulmonary effort is normal. No respiratory distress.     Breath sounds: Normal breath sounds. No stridor. No wheezing, rhonchi or rales.  Abdominal:     General: Abdomen is flat. There is no distension.     Palpations: Abdomen is soft.  Musculoskeletal:        General: No swelling or tenderness. Normal range of motion.     Right lower leg: No edema.     Left lower leg: No edema.  Skin:    General: Skin is warm and dry.  Coloration: Skin is not jaundiced or pale.     Findings: No bruising, erythema or rash.  Neurological:     General: No focal deficit present.     Mental Status: He is alert and oriented to person, place, and time.     Cranial Nerves: No cranial nerve deficit.     Coordination: Coordination normal.  Psychiatric:        Mood and Affect: Mood normal.     ED Results / Procedures / Treatments   Labs (all labs ordered are listed, but only abnormal results are displayed) Labs Reviewed - No data to display  EKG None  Radiology No results found.  Procedures Procedures    Medications Ordered in ED Medications - No data to display  ED Course/ Medical Decision Making/ A&P                                Medical Decision Making Risk Prescription drug management.   This patient is a 38 year old male who presents  to the ED for concern of Right ear pain.   Differential diagnoses prior to evaluation: AOM, middle ear effusion, otitis externa, mastoiditis, URI, strep,  Past Medical History / Social History / Additional history: Chart reviewed. Pertinent results include: Seizure disorder, obesity, hyperbilirubinemia  Was previously seen by both urgent care and ENT.  Was told to follow-up with ENT if pain persisted.  Previously treated with amoxicillin.  Medications / Treatment: Cefdinir was prescribed.  Patient is a 38 year old male presents to the ED today complaining of right ear pain that is recurrent.  Had been previously treated with amoxicillin by PCP and seen by ENT.  ENT recommended that he follow-up with them if he continues to have symptoms.  He was confused as to whether or not he should follow-up here or follow-up by ENT.  Denies headache, fever, vertigo, hearing loss, vision changes,, chest pain, shortness breath, diarrhea, nausea, vomiting.  Physical exam was remarkable for mild erythema noted to the right TM as well as middle ear effusions noted bilaterally.  For this reason patient was prescribed cefdinir due to recent use of amoxicillin.  Also alerted him that he should follow-up with ENT as previously discussed with ENT.  Provided strict return to ER precautions.  Patient vitals have remained stable throughout his time here.  Low suspicion for any emergent pathology at this time.  Patient expressed agreement understanding of plan.   Disposition: After consideration of the diagnostic results and the patients response to treatment, I feel that patient benefit from discharge and treatment as above.   emergency department workup does not suggest an emergent condition requiring admission or immediate intervention beyond what has been performed at this time. The plan is: Cefdinir, follow-up with ENT, return for new or worsening symptoms. The patient is safe for discharge and has been instructed to  return immediately for worsening symptoms, change in symptoms or any other concerns.    Final Clinical Impression(s) / ED Diagnoses Final diagnoses:  Fluid level behind tympanic membrane of both ears  Recurrent AOM (acute otitis media)    Rx / DC Orders ED Discharge Orders          Ordered    cefdinir (OMNICEF) 300 MG capsule  Daily        08/07/23 1927              Lunette Stands, [place] 08/07/23 1934    Nechama Guard,  [place] Ravens, [place] 08/07/23 1936    Terald Sleeper, MD 08/07/23 [phone]

## 2023-08-07 NOTE — ED Triage Notes (Signed)
Pt complains of bilateral ear pain and sore throat x 3-4 weeks. Pt was seen at UC 2 weeks ago and ENT 1 week ago and pain has not gone away. Denies fever. Denies any drainage. Pt states UC gave flonase and zyrtec and flonase caused nose to bleed so pt stopped taking.

## 2023-10-05 DIAGNOSIS — H6123 Impacted cerumen, bilateral: Secondary | ICD-10-CM | POA: Diagnosis not present

## 2024-01-04 DIAGNOSIS — H6123 Impacted cerumen, bilateral: Secondary | ICD-10-CM | POA: Diagnosis not present

## 2024-04-05 DIAGNOSIS — H6123 Impacted cerumen, bilateral: Secondary | ICD-10-CM | POA: Diagnosis not present

## 2024-04-09 ENCOUNTER — Encounter: Payer: Medicaid Other | Admitting: Internal Medicine

## 2024-04-11 ENCOUNTER — Encounter: Payer: Medicaid Other | Admitting: Internal Medicine

## 2024-07-05 DIAGNOSIS — H6123 Impacted cerumen, bilateral: Secondary | ICD-10-CM | POA: Diagnosis not present
